# Patient Record
Sex: Male | Born: 1935 | Race: White | Hispanic: No | Smoking: Former smoker
Health system: Southern US, Community
[De-identification: ages and names within clinical notes are randomized; demographics above are authoritative.]

## PROBLEM LIST (undated history)

## (undated) DIAGNOSIS — I2510 Atherosclerotic heart disease of native coronary artery without angina pectoris: Secondary | ICD-10-CM

## (undated) DIAGNOSIS — I4891 Unspecified atrial fibrillation: Secondary | ICD-10-CM

## (undated) DIAGNOSIS — E785 Hyperlipidemia, unspecified: Secondary | ICD-10-CM

## (undated) DIAGNOSIS — I10 Essential (primary) hypertension: Secondary | ICD-10-CM

## (undated) DIAGNOSIS — M1990 Unspecified osteoarthritis, unspecified site: Secondary | ICD-10-CM

## (undated) HISTORY — PX: HERNIA REPAIR: SHX51

## (undated) HISTORY — DX: Unspecified osteoarthritis, unspecified site: M19.90

## (undated) HISTORY — DX: Hyperlipidemia, unspecified: E78.5

## (undated) HISTORY — DX: Atherosclerotic heart disease of native coronary artery without angina pectoris: I25.10

## (undated) HISTORY — PX: PROSTATECTOMY: SHX69

## (undated) HISTORY — DX: Unspecified atrial fibrillation: I48.91

## (undated) HISTORY — PX: OTHER SURGICAL HISTORY: SHX169

## (undated) HISTORY — DX: Essential (primary) hypertension: I10

---

## 1998-01-11 ENCOUNTER — Inpatient Hospital Stay (HOSPITAL_COMMUNITY): Admission: EM | Admit: 1998-01-11 | Discharge: 1998-01-17 | Payer: Self-pay | Admitting: Cardiology

## 1998-01-13 ENCOUNTER — Encounter: Payer: Self-pay | Admitting: Cardiology

## 1998-01-13 HISTORY — PX: CORONARY ARTERY BYPASS GRAFT: SHX141

## 1998-01-14 ENCOUNTER — Encounter: Payer: Self-pay | Admitting: Cardiology

## 1998-01-15 ENCOUNTER — Encounter: Payer: Self-pay | Admitting: Surgery

## 1998-12-26 ENCOUNTER — Encounter: Payer: Self-pay | Admitting: Urology

## 1998-12-28 ENCOUNTER — Ambulatory Visit (HOSPITAL_COMMUNITY): Admission: RE | Admit: 1998-12-28 | Discharge: 1998-12-28 | Payer: Self-pay | Admitting: Urology

## 2002-01-19 ENCOUNTER — Ambulatory Visit (HOSPITAL_COMMUNITY): Admission: RE | Admit: 2002-01-19 | Discharge: 2002-01-19 | Payer: Self-pay | Admitting: Cardiology

## 2002-01-19 ENCOUNTER — Encounter: Payer: Self-pay | Admitting: Cardiology

## 2002-01-27 ENCOUNTER — Encounter: Payer: Self-pay | Admitting: Cardiology

## 2002-01-27 ENCOUNTER — Ambulatory Visit (HOSPITAL_COMMUNITY): Admission: RE | Admit: 2002-01-27 | Discharge: 2002-01-27 | Payer: Self-pay | Admitting: Cardiology

## 2002-02-22 ENCOUNTER — Encounter: Payer: Self-pay | Admitting: Pediatrics

## 2002-02-22 ENCOUNTER — Inpatient Hospital Stay (HOSPITAL_COMMUNITY): Admission: RE | Admit: 2002-02-22 | Discharge: 2002-02-25 | Payer: Self-pay | Admitting: Orthopedic Surgery

## 2002-02-22 ENCOUNTER — Encounter: Payer: Self-pay | Admitting: Orthopedic Surgery

## 2002-02-25 ENCOUNTER — Ambulatory Visit (HOSPITAL_COMMUNITY)
Admission: RE | Admit: 2002-02-25 | Discharge: 2002-02-25 | Payer: Self-pay | Admitting: Physical Medicine & Rehabilitation

## 2002-02-25 ENCOUNTER — Inpatient Hospital Stay
Admission: RE | Admit: 2002-02-25 | Discharge: 2002-03-05 | Payer: Self-pay | Admitting: Physical Medicine & Rehabilitation

## 2002-03-01 ENCOUNTER — Encounter: Payer: Self-pay | Admitting: Physical Medicine & Rehabilitation

## 2002-03-14 ENCOUNTER — Observation Stay (HOSPITAL_COMMUNITY): Admission: RE | Admit: 2002-03-14 | Discharge: 2002-03-15 | Payer: Self-pay | Admitting: Urology

## 2002-03-22 ENCOUNTER — Inpatient Hospital Stay (HOSPITAL_COMMUNITY): Admission: EM | Admit: 2002-03-22 | Discharge: 2002-03-25 | Payer: Self-pay | Admitting: Urology

## 2002-03-22 ENCOUNTER — Emergency Department (HOSPITAL_COMMUNITY): Admission: EM | Admit: 2002-03-22 | Discharge: 2002-03-22 | Payer: Self-pay | Admitting: Emergency Medicine

## 2002-03-24 ENCOUNTER — Encounter: Payer: Self-pay | Admitting: Urology

## 2002-05-04 ENCOUNTER — Ambulatory Visit (HOSPITAL_COMMUNITY): Admission: RE | Admit: 2002-05-04 | Discharge: 2002-05-04 | Payer: Self-pay | Admitting: Internal Medicine

## 2002-05-04 ENCOUNTER — Encounter: Payer: Self-pay | Admitting: Cardiology

## 2002-08-01 ENCOUNTER — Encounter: Admission: RE | Admit: 2002-08-01 | Discharge: 2002-08-01 | Payer: Self-pay | Admitting: Orthopedic Surgery

## 2002-08-01 ENCOUNTER — Encounter: Payer: Self-pay | Admitting: Diagnostic Radiology

## 2002-08-01 ENCOUNTER — Encounter: Payer: Self-pay | Admitting: Orthopedic Surgery

## 2002-08-26 ENCOUNTER — Encounter: Payer: Self-pay | Admitting: Orthopedic Surgery

## 2002-08-26 ENCOUNTER — Encounter: Admission: RE | Admit: 2002-08-26 | Discharge: 2002-08-26 | Payer: Self-pay | Admitting: Orthopedic Surgery

## 2003-01-10 ENCOUNTER — Emergency Department (HOSPITAL_COMMUNITY): Admission: EM | Admit: 2003-01-10 | Discharge: 2003-01-11 | Payer: Self-pay | Admitting: Emergency Medicine

## 2003-02-16 ENCOUNTER — Encounter: Admission: RE | Admit: 2003-02-16 | Discharge: 2003-02-16 | Payer: Self-pay | Admitting: Orthopedic Surgery

## 2003-12-27 ENCOUNTER — Ambulatory Visit: Payer: Self-pay | Admitting: Gastroenterology

## 2004-04-17 ENCOUNTER — Ambulatory Visit: Payer: Self-pay | Admitting: Cardiology

## 2005-04-22 ENCOUNTER — Ambulatory Visit: Payer: Self-pay | Admitting: Cardiology

## 2006-07-06 ENCOUNTER — Encounter: Payer: Self-pay | Admitting: Cardiology

## 2006-07-06 ENCOUNTER — Ambulatory Visit: Payer: Self-pay

## 2006-07-06 ENCOUNTER — Ambulatory Visit: Payer: Self-pay | Admitting: Cardiology

## 2007-07-07 ENCOUNTER — Ambulatory Visit: Payer: Self-pay | Admitting: Cardiology

## 2007-12-15 ENCOUNTER — Ambulatory Visit: Payer: Self-pay | Admitting: Cardiology

## 2008-11-15 ENCOUNTER — Encounter: Payer: Self-pay | Admitting: Cardiology

## 2008-12-11 DIAGNOSIS — I10 Essential (primary) hypertension: Secondary | ICD-10-CM

## 2008-12-11 DIAGNOSIS — E785 Hyperlipidemia, unspecified: Secondary | ICD-10-CM | POA: Insufficient documentation

## 2008-12-11 DIAGNOSIS — I25810 Atherosclerosis of coronary artery bypass graft(s) without angina pectoris: Secondary | ICD-10-CM | POA: Insufficient documentation

## 2008-12-13 ENCOUNTER — Encounter: Payer: Self-pay | Admitting: Cardiology

## 2008-12-14 ENCOUNTER — Ambulatory Visit: Payer: Self-pay | Admitting: Cardiology

## 2009-10-23 ENCOUNTER — Encounter: Payer: Self-pay | Admitting: Cardiology

## 2009-12-17 ENCOUNTER — Encounter: Payer: Self-pay | Admitting: Cardiology

## 2009-12-17 ENCOUNTER — Ambulatory Visit: Payer: Self-pay | Admitting: Cardiology

## 2010-02-14 NOTE — Assessment & Plan Note (Signed)
Summary: f1y   Visit Type:  Follow-up- 1 year Primary Provider:  [place] [place]  CC:  No cardiac complaints- occasional edema..  History of Present Illness: The patient is 77 years old and return for management of CAD. He had bypass surgery in 2000. He had a catheterization done in 2007 which showed patent vein grafts after having an abnormal Myoview scan. He has been doing quite well has had no recent chest pain shortness of breath or palpitations. Despite his age he still works in the garden and mows his lawn.  He has a wife who is 38 years old and they've been married for 70 years.  His last echocardiogram was in June of 2008 at which time his ejection fraction was 55-60% and he had mild aortic stenosis.  Current Medications (verified): 1)  Metoprolol Succinate 25 Mg Xr24h-Tab (Metoprolol Succinate) .... Take One Tablet By Mouth Daily 2)  Cyanocobalamin 1000 Mcg/ml Soln (Cyanocobalamin) .Marland Kitchen.. 1 Tab Once Daily 3)  Multivitamins   Tabs (Multiple Vitamin) .Marland Kitchen.. 1 Tab By Mouth Once Daily Occasionally 4)  Simvastatin 80 Mg Tabs (Simvastatin) .... Take 1/2 Tab By Mouth Daily At Bedtime 5)  Potassium Chloride Crys Cr 20 Meq Cr-Tabs (Potassium Chloride Crys Cr) .... Take 1/2  Tablet By Mouth Daily 6)  Vitamin E 400 Unit  Caps (Vitamin E) .Marland Kitchen.. 1 Tab Once Daily 7)  Aspirin 81 Mg Tbec (Aspirin) .... Take One Tablet By Mouth Daily 8)  Hydromorphone Hcl 2 Mg Tabs (Hydromorphone Hcl) .... Take One Tab By Mouth Three Times A Day As Needed 9)  Tylenol 325 Mg Tabs (Acetaminophen) .... Take One Tab By Mouth Two To Three Times Daily As Needed 10)  Omeprazole 20 Mg Cpdr (Omeprazole) .... Take One To Two Tablets By Mouth Once Daily 11)  Garlic Oil 1000 Mg Caps (Garlic) .... Take One Capsule By Mouth Once Daily  Allergies (verified): No Known Drug Allergies  Past History:  Past Medical History: Reviewed history from 12/11/2008 and no changes required. 1. Coronary artery disease status post coronary bypass  graft surgery     in 2000 with patent grafts in 2004. 2. Good left ventricular function. 3. Hyperlipidemia. 4. Hypertension. 5. Degenerative joint disease.   Review of Systems       ROS is negative except as outlined in HPI.   Vital Signs:  Patient profile:   77 year old male Height:      70 inches Weight:      212 pounds BMI:     30.53 Pulse rate:   55 / minute BP sitting:   158 / 62  (left arm) Cuff size:   large  Vitals Entered By: Sherri Rad, RN, BSN (December 17, 2009 5:09 PM)  Physical Exam  Additional Exam:  Gen. Well-nourished, in no distress   Neck: No JVD, thyroid not enlarged, no carotid bruits Lungs: No tachypnea, clear without rales, rhonchi or wheezes Cardiovascular: Rhythm regular, PMI not displaced,  heart sounds  normal, 2/6 SEM, trace bilateral  peripheral edema, pulses normal in all 4 extremities. Abdomen: BS normal, abdomen soft and non-tender without masses or organomegaly, no hepatosplenomegaly. MS: No deformities, no cyanosis or clubbing   Neuro:  No focal sns   Skin:  no lesions    Impression & Recommendations:  Problem # 1:  CAD, AUTOLOGOUS BYPASS GRAFT (ICD-414.02) S/P CABG with patent grafts 2007.  No CP .  Stable. His updated medication list for this problem includes:    Metoprolol Succinate 25  Mg Xr24h-tab (Metoprolol succinate) .Marland Kitchen... Take one tablet by mouth daily    Aspirin 81 Mg Tbec (Aspirin) .Marland Kitchen... Take one tablet by mouth daily  Orders: EKG w/ Interpretation (93000)  Problem # 2:  HYPERTENSION, BENIGN (ICD-401.1) BP have been low on other readings.  Stable. His updated medication list for this problem includes:    Metoprolol Succinate 25 Mg Xr24h-tab (Metoprolol succinate) .Marland Kitchen... Take one tablet by mouth daily    Aspirin 81 Mg Tbec (Aspirin) .Marland Kitchen... Take one tablet by mouth daily    Furosemide 20 Mg Tabs (Furosemide) .Marland Kitchen... Take one tablet by mouth daily as needed  Problem # 3:  HYPERLIPIDEMIA-MIXED (ICD-272.4) Managed with  Simvistatin.  Good readings by PCP. His updated medication list for this problem includes:    Simvastatin 80 Mg Tabs (Simvastatin) .Marland Kitchen... Take 1/2 tab by mouth daily at bedtime  Patient Instructions: 1)  Take Lasix (Furosemide) 20mg  one tab by mouth once daily as needed. 2)  Your physician wants you to follow-up in:  1 year with Dr. Clifton James. You will receive a reminder letter in the mail two months in advance. If you don't receive a letter, please call our office to schedule the follow-up appointment. Prescriptions: FUROSEMIDE 20 MG TABS (FUROSEMIDE) Take one tablet by mouth daily as needed  #30 x 4   Entered by:   Sherri Rad, RN, BSN   Authorized by:   Lenoria Farrier, MD, [facility]   Signed by:   Sherri Rad, RN, BSN on 12/17/2009   Method used:   Electronically to        [facility]* (retail)       [address]       [place], [place]  [address]       Ph: [phone]       Fax: [phone]   RxID:   [id number]

## 2010-05-28 NOTE — Assessment & Plan Note (Signed)
[facility]                            CARDIOLOGY OFFICE NOTE   NAME:George [name], George [name]                         MRN:          [id number]  DATE:07/06/2006                            DOB:          1916/04/14    PRIMARY CARE PHYSICIAN:  Dr. Nadine Counts, [place].   CLINICAL HISTORY:  George [name] is 77 years old and returns for followup  management of his coronary heart disease. He had bypass surgery in 2000  and had an abnormal Cardiolite in 2004 prior to knee surgery and  underwent catheterization and was found to have a patent graft at that  time. He says he has been doing well from the standpoint of his heart,  has had no recent chest pain, shortness of breath or palpitations.   PAST MEDICAL HISTORY:  Significant for degenerative joint disease and he  is status post right total knee replacement. He has hyperlipidemia.   CURRENT MEDICATIONS:  Simvastatin, Prilosec, aspirin, diclofenac,  metoprolol and vitamins.   SOCIAL HISTORY:  His wife who lives with him is 90 years hold and she  had a hip fracture 3 years ago and her health is not as good as his.   PHYSICAL EXAMINATION:  VITAL SIGNS:  Her blood pressure is 144/66 and  her pulse 66 and regular.  NECK:  There was no vein distention. Carotid pulses were full without  bruits.  CHEST:  Clear.  HEART:  Cardiac rhythm was regular. There was a grade 2/6 high pitch  murmur at the left sternal edge which could be heard halfway out to the  apex. I could hear no diastolic murmur.  ABDOMEN:  Soft with normal bowel sounds. There was no  hepatosplenomegaly.  EXTREMITIES:  Peripheral pulses were full and there was no peripheral  edema.   Electrocardiogram showed first degree AV block and sinus bradycardia and  was otherwise normal.   IMPRESSION:  1. Coronary artery disease status post coronary artery bypass graft      February 2000 with patent grafts 2004.  2. Good left ventricular function.  3. Systolic  murmur, questionable mitral in origin.  4. Hyperlipidemia.  5. Degenerative joint disease.   RECOMMENDATIONS:  I think George [name] is doing well. I will plan to get  an echocardiogram to evaluate his murmur. I am __________  certain that  this is aortic murmur or mitral murmur.  Will plan to see him back in 1 year. I also gave him a prescription for  nitroglycerin. George [name] had recently done laboratory studies on him.     George Elvera Lennox Juanda Chance, MD, [facility]  Electronically Signed    BRB/MedQ  DD: 07/06/2006  DT: 07/07/2006  Job #: [id number]

## 2010-05-28 NOTE — Assessment & Plan Note (Signed)
[facility]                            CARDIOLOGY OFFICE NOTE   NAME:[name], [name]                         MRN:          [id number]  DATE:12/15/2007                            DOB:          1916-03-25    PRIMARY CARE PHYSICIAN:  Dr. Nadine Counts.   CLINICAL HISTORY:  George [name] is 77 years old and returns for followup  management of his coronary heart disease.  He had bypass surgery in  2000, which was performed emergently and has done well since that time.  He had a cath in 2004 after an abnormal Cardiolite, which showed patent  grafts.  He has done well recently with no recent chest pain, shortness  of breath, or palpitations.   His past medical history is significant for right total knee  replacement.  He has hyperlipidemia and hypertension.   His current medications include simvastatin 80 mg one-half tablet at  bedtime, potassium 20 mEq one-half tablet daily, Prilosec, aspirin 81 mg  daily, metoprolol 50 mg daily, and vitamins.   SOCIAL HISTORY:  He lives with his wife who is 33 years old.  She is  currently in rehab following an injury.  He does not smoke.   On physical examination, blood pressure is 130/65 and pulse 56 and  regular.  There was no venous tension.  The carotid pulses were full  without bruits.  Chest was clear.  Cardiac rhythm was regular.  He had  no murmurs or gallops.  Abdomen was soft without organomegaly.  Peripheral pulses were full and there is no peripheral edema.   Electrocardiogram showed increased voltage and sinus bradycardia.   IMPRESSION:  1. Coronary artery disease status post coronary bypass graft surgery      in 2000 with patent grafts in 2004.  2. Good left ventricular function.  3. Hyperlipidemia.  4. Hypertension.  5. Degenerative joint disease.   RECOMMENDATIONS:  I think George [name] is doing quite well.  Dr. Harrison Mons  has been following his cholesterol panel.  His other risk factors seem  to be  under good control.  I will plan to see him back in followup in a  year.     Bruce Elvera Lennox Juanda Chance, MD, [facility]  Electronically Signed   BRB/MedQ  DD: 12/15/2007  DT: 12/16/2007  Job #: [id number]

## 2010-05-28 NOTE — Assessment & Plan Note (Signed)
[facility]                            CARDIOLOGY OFFICE NOTE   NAME:[name], [name]                         MRN:          [id number]  DATE:07/07/2007                            DOB:          10-28-1916    PRIMARY CARDIOLOGIST:  George Beals. Juanda Chance, MD, [facility]   PRIMARY CARE George [name]:  George [name] in [place].   PATIENT PROFILE:  An 77 year old Caucasian male with a prior history of  CAD who presents for yearly followup.   PROBLEMS:  1. CAD.      a.     Status post CABG in February 2000 with patent grafts on       catheterization in 2004.  2. History of normal LV function.  3. Mild aortic stenosis by echo in 2008.  4. Hyperlipidemia.  5. Degenerative joint disease.   HISTORY OF PRESENT ILLNESS:  An 77 year old Caucasian male with the  above problem list.  He was last seen about a year ago.  Over the past  year, he has done well and has remained active working in his half-acre  garden, mowing his acre yard, and also taking care of his wife who has a  respiratory illness.  He is active around the home, prepares all the  meals, and helps to bathe and clothe his wife.  Despite all this  activity, he has not had any symptoms or limitations and is doing  remarkably well.  He offers no complaints today.   ALLERGIES:  CODEINE.   HOME MEDICATIONS:  1. Simvastatin 40 mg nightly.  2. Potassium 10 mEq daily.  3. Omeprazole 20 mg b.i.d.  4. Aspirin 81 mg daily.  5. Metoprolol 25 mg b.i.d.  6. Vitamin B12 1000 mg daily.  7. B complex daily.  8. Nitroglycerin p.r.n.   PHYSICAL EXAMINATION:  Blood pressure 128/65, heart rate 50,  respirations 16.  He is afebrile.  His weigh is 210 pounds.  Pleasant  white male in no acute distress, awake, alert and oriented x3.  HEENT:  Normal.  NEURO:  Grossly intact, nonfocal.  SKIN:  Warm and dry without lesions or masses.  NECK:  No bruits or JVD.  LUNGS:  Respirations are unlabored, clear to auscultation.  CARDIAC:  Regular S1 and S2, no S3 or S4, with a 2/6 systolic ejection  murmur at the right upper sternal border.  ABDOMEN:  Round, soft, nontender, nondistended.  Bowel sounds present.  EXTREMITIES:  Lower extremities warm, dry, pink.  No clubbing, cyanosis,  or edema.  Dorsalis pedis and posterior tibial pulses 2+ and equal  bilaterally.   ACCESSORY CLINICAL FINDINGS:  None.   ASSESSMENT/PLAN:  1. Coronary artery disease.  The patient is doing well and remains on      aspirin, statin, and beta-blocker therapy.  2. Hyperlipidemia.  The patient remains on statin therapy.  He had an      LDL in April that was 90.  Total cholesterol 146.  His LFTs were      normal at that time.  3. Mild aortic stenosis.  The patient is asymptomatic.  He had an      echocardiogram a year ago.   DISPOSITION:  Follow up with George [name] in 6 months or sooner if  necessary.      George [name], ANP  Electronically Signed      George Beals. Juanda Chance, MD, [facility]  Electronically Signed   CB/MedQ  DD: 07/07/2007  DT: 07/08/2007  Job #: [id number]

## 2010-05-31 NOTE — Op Note (Signed)
NAME:  George [name], George [name]                       ACCOUNT NO.:  [id number]   MEDICAL RECORD NO.:  [id number]                   PATIENT TYPE:  INP   LOCATION:  5005                                 FACILITY:  [facility]   PHYSICIAN:  Burnard Bunting, M.D.                 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   PROCEDURE:  Right total knee arthroplasty.   SURGEON:  Burnard Bunting, M.D.   ANESTHESIA:  General endotracheal.   ESTIMATED BLOOD LOSS:  100 mL.  Drains; Hemovac x1.   TOURNIQUET TIME:  2 hours at 300 mmHg.   COMPONENTS:  Cemented 11 femur, 9 tibia with 10 mm insert, and cemented 9  resurfaced patella.   DESCRIPTION OF PROCEDURE:  The patient was brought to the operating room  where general endotracheal anesthesia was induced.  Preoperative IV  antibiotics were administered.  The right leg was prepped with Duraprep  solution and draped in a sterile manner. The patient had post traumatic  valgus deformity at the junction of the proximal third and middle third of  the tibial.  The leg was elevated and exsanguinated with the Esmarch wrap  and the tourniquet was inflated. Anterior incision was utilized. The skin  and subcutaneous tissue were sharply divided.  A median parapatellar  arthrotomy was utilized.  0 Vicryl suture was used to mark the anatomic  location of the division of the distal quadriceps tendon for later anatomic  reapproximation. The lateral patellofemoral ligament was released. Fat pad  was excised.  No stripping of the medial collateral ligament was performed.  There was careful stripping of the lateral aspect was performed.  Osteophytes were removed.  The patient had significant lateral compartment  wear. The knee was flexed.  A starting point was made slightly medial to the  standard location in order to allow for increased varus in the distal  femoral cut.  Chondral cartilage was removed from the articular surface of  the medial femoral condyle to allow for less than 5 degrees valgus cut on  the distal femur. The distal femoral cut was made.  The femur was sized to a  size 11.  Anterior, posterior, and chamfer cuts were then performed.  At  this time, attention was directed toward the tibia.  According to the  preoperative planning and preoperative templating, a medial wedge type cut  was performed. This would align the mechanical axis more squarely under the  hip.  The external alignment guide was utilized and a currently medial sided  cut was made to allow the leg to swing into varus.  Following the posterior  retractor and collateral ligament retractors were  placed prior to making  that cut.  Following that cut, the lateral ligaments were partially released  off of the femur.  This included the popliteus and lateral collateral  ligament.  Iliotibial band was also partially released off of Gerdy's  tubercle.  Good balance, flexion, and extension was achieved.  Box cut was  then made and the trial components were placed.  The patella tracked nicely  and the knee had slight increased laxity to valgus stress, but overall was  well balanced. The patella was then cut and resurfaced in a free-hand manner  and sized to a size 9 patella.  At this time, the trial components were  removed.  The knee was thoroughly irrigated with pulsatile lavage.  With  trial components in place, the leg had a neutral alignment.  This was  checked with intraoperative x-ray.  At this time, the tourniquet was  released.  Bleeding points were controlled using electrocautery.  The  components were then cemented into position including the femur, tibia, and  patella.  The 10 spacer was then placed. The knee had a  good range of  motion.  A lateral release was not required because the patella tracked very  nicely. The patient had good range of motion.  The tibia which was cut in 0  degrees slope was fitted with the Hyperflex insert.  At this time the  Hemovac drain was placed and the MCL imbrication was performed using #1  suture.  This gave good balance to the collateral ligaments in extension and  flexion.  The arthrotomy was then closed using #1 and 0 Vicryl suture.  Skin  was closed using interrupted inverted 2-0 Vicryl suture and skin staples.  The patient was then placed into a knee immobilizer. He tolerated the  procedure well without immediate complications.  He was transferred to the  recovery room in stable condition.                                               Burnard Bunting, M.D.    GSD/MEDQ  D:  02/25/2002  T:  02/25/2002  Job:  [id number]
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
## 2010-05-31 NOTE — Op Note (Signed)
   NAME:  George [name], George [name]                       ACCOUNT NO.:  [id number]   MEDICAL RECORD NO.:  [id number]                   PATIENT TYPE:  AMB   LOCATION:  DAY                                  FACILITY:  [facility]   PHYSICIAN:  Sigmund I. Patsi Sears, M.D.         DATE OF BIRTH:  06-18-1916   DATE OF PROCEDURE:  03/14/2002  DATE OF DISCHARGE:                                 OPERATIVE REPORT   PREOPERATIVE DIAGNOSES:  1. Urethral stricture.  2. Bladder neck contracture.   POSTOPERATIVE DIAGNOSES:  1. Urethral stricture.  2. Bladder neck contracture.   PROCEDURES:  1. Cystourethroscopy.  2. Transurethral incision of bladder neck contracture.  3. Urethral dilation.  4. Urolume stent placement at bladder neck.   SURGEON:  Sigmund I. Patsi Sears, M.D.   ANESTHESIA:  General endotracheal.   PREPARATION:  After appropriate preanesthesia, the patient was brought to  the operating room and placed on the operating table in the dorsal supine  position.  The patient failed spinal anesthetic placement and general LMA  placement, and then had general endotracheal anesthesia.  The patient  stabilized and then underwent cystourethroscopy, which showed that the  patient had a penile prosthesis in place, a bladder neck contracture.  This  was incised and dilated to a size 28 [place] and then measured.  The patient  then had a 2.5 cm Urolume urethral stent placed, which was visually noted to  be just inside the bladder neck, approximately 0.5-1 cm, to a point just  proximal to the sphincter.  The sphincter was noted to visually close.  There was some bleeding noted from the incision of the bladder neck  contracture and the urethral stricture, and these areas were cauterized as  best as possible.  The bladder was drained of fluid with a 14 French  catheter.  The patient tolerated the procedure well.  He is going to be  admitted for 23-hour observation.           Sigmund I. Patsi Sears, M.D.    SIT/MEDQ  D:  03/14/2002  T:  03/14/2002  Job:  [id number]

## 2010-05-31 NOTE — Discharge Summary (Signed)
NAME:  George [name], George [name]                       ACCOUNT NO.:  [id number]   MEDICAL RECORD NO.:  [id number]                   PATIENT TYPE:  INP   LOCATION:                                       FACILITY:  [facility]   PHYSICIAN:  Ellwood Dense, M.D.                DATE OF BIRTH:  [birth year]   DATE OF ADMISSION:  02/25/2002  DATE OF DISCHARGE:  03/05/2002                                 DISCHARGE SUMMARY   DISCHARGE DIAGNOSES:  1. Right total knee arthroplasty on February 22, 2002.  2. Anemia.  3. Coumadin for deep venous thrombosis prophylaxis.  4. Benign prostatic hypertrophy.  5. Coronary artery disease by coronary artery bypass grafting.   HISTORY OF PRESENT ILLNESS:  This 77 year old male was admitted on February 22, 2002, with end-stage degenerative joint disease of the right knee and no  relief with conservative care.  He underwent a right total knee arthroplasty  on February 22, 2002, per Burnard Bunting, M.D.  Placed on Coumadin for deep  venous thrombosis prophylaxis and weightbearing as tolerated.  Postoperative  distal and proximal upper extremity weakness.  Neurology consult with  Deanna Artis. Sharene Skeans, M.D.  Cervical CT showed a bulging, questionable  herniated disk without critical spinal stenosis and no obvious lesion  identified.  He continued to improve with strength returning fully to 5/5.  No further work-up was requested.  He did have hospital course anemia after  surgery and received transfusion.  He was found to have an NCL laxity and a  brace was applied per the orthopedic service x 6 weeks.  Urology received  follow-up for difficulty placing a Foley catheter tube and a history of  penial prosthesis for which he received follow-up with Sigmund I.  Patsi Sears, M.D., for and tolerated the procedure well.  He was moderate  assist for bed mobility and supervision for his ambulation.  He was admitted  for comprehensive rehabilitation program.   PAST MEDICAL HISTORY:   See discharge diagnoses.   ALLERGIES:  None.   SOCIAL HISTORY:  Denies alcohol or tobacco.   MEDICATIONS PRIOR TO ADMISSION:  1. Multivitamins.  2. Vitamin E.  3. Iron.  4. Lopressor.  5. Zocor.  6. Potassium supplement.  7. Aspirin.   SOCIAL HISTORY:  Lives with wife in [place], [place].  Independent  with a cane prior to admission.  One-level home with one step to entry.  Wife with bilateral total hip replacement with limited assistance.  Local  son works day shift.   HOSPITAL COURSE:  The patient did well while on rehabilitation services with  therapies initiated on a daily basis. The following issues were followed  during the patient's rehabilitation course.  Pertaining to the patient's  right total knee replacement, surgical site healing nicely.  Staples  remained intact.  He did have some obvious swelling to the site.  Follow-up  x-rays showed no loosening  of components, but there was a large joint  effusion which Burnard Bunting, M.D., was made aware of.  Will continue to  follow.  No obvious hematoma identified.  He would follow up with Dr. August Saucer  on March 11, 2002.  His active range of motion was still at 80 degrees.  He continued on Coumadin for deep venous thrombosis prophylaxis.  This would  be followed up per protocol by [facility].  Blood pressures remained  controlled with Lopressor.  There were no orthostatic changes.  For  postoperative anemia, he remained on iron supplement with latest hemoglobin  9.1.  He had no chest pain or shortness of breath.  Noted history of benign  prostatic hypertrophy with TURP in the past, followed by Sigmund I.  Patsi Sears, M.D.  He did have some urgency of bladder, but this had improved  during his rehabilitation stay.  Order was given for bladder scans to check  for any urinary retention.  He would follow up with Dr. Patsi Sears.   The latest labs showed an INR of 2.8, hemoglobin 9.1, hematocrit 26.8,  sodium 131,  potassium 4.1, BUN 27, and creatinine 1.8.   DISCHARGE MEDICATIONS:  1. Coumadin with latest dose of 6 mg.  He would complete protocol by March 23, 2002.  2. Multivitamins daily.  3. Trinsicon twice daily.  4. Lopressor 25 mg every 12 hours.  5. Zocor 40 mg daily.  6. Potassium chloride 20 mEq daily.  7. Tylox as needed for pain.   ACTIVITY:  Weightbearing as tolerated with knee brace when out of bed.   DIET:  Regular.   WOUND CARE:  Cleanse incision daily with warm soap and water.  Call G. Dorene Grebe, M.D., if any increased redness, drainage, or fever.   FOLLOW-UP:  Home health physical and occupational therapy.  Home health  nurse to be arranged for [facility] for completion of Coumadin  protocol.  Follow up with Nadine Counts, for medical management.     Mariam Dollar, P.A.                     Ellwood Dense, M.D.    DA/MEDQ  D:  03/04/2002  T:  03/04/2002  Job:  [id number]   cc:   G. Dorene Grebe, M.D.  [address]  [place]  [place] [address]  Fax: [phone]  [address]  [place] [address]  Fax: [phone]   Sigmund I. Patsi Sears, M.D.  [address]  [place]  [place] [address]  Fax: [phone]

## 2010-05-31 NOTE — H&P (Signed)
NAME:  George [name], George [name]                       ACCOUNT NO.:  [id number]   MEDICAL RECORD NO.:  [id number]                   PATIENT TYPE:  INP   LOCATION:  0371                                 FACILITY:  [facility]   PHYSICIAN:  Sigmund I. Patsi Sears, M.D.         DATE OF BIRTH:  February 13, 1916   DATE OF ADMISSION:  03/22/2002  DATE OF DISCHARGE:                                HISTORY & PHYSICAL   HISTORY OF PRESENT ILLNESS:  This is an 77 year old male who is status post  radical prostatectomy from an adenocarcinoma of the prostate in 1989.  He is  also status post implantation of Mentor penile prosthesis (semi-rigid) in  1991.  The patient had orthopedic surgery on his knee approximately one  month ago.  During the hospital, he had problems with urinary retention and  had to have a cystoscope assisted placement of a urinary catheter.  The  patient has returned to Dr. Eston Esters office on two different occasions to  have a voiding trial, which he has been able to successfully void after  removal of the catheter.  Approximately last evening the patient states that  he took a triple dose of Coumadin per the [facility] orders which  equalled to about 7 to 7.5 mg of Coumadin.  As the night progressed, the  patient became unable to void and by this morning had problems with urinary  retention.  He went to the emergency room at [facility], where the  emergency room physicians placed the catheter after several tries.  He  presented to our office today with a catheter in place and voiding some  blood.  In the office today we took out the present catheter and tried to  examine the patient's bladder with the cystoscope, and we were unable to  pass the cystoscope into the bladder.  The patient is also two weeks status  post placement of a urethral stent.  Since we were unable to pass the  catheter into the bladder or visualize the bladder.  We desired to admit the  patient to the  hospital today for possible placement of suprapubic tube  tomorrow under general anesthesia if he does not  void this p.m.   PAST MEDICAL HISTORY:  The patient has a history of coronary artery disease  with a coronary artery bypass graft in the past.   ALLERGIES:  CODEINE and MORPHINE.   MEDICATIONS:  1. Coumadin.  2. Potassium supplement.  3. Toprol 25 mg every day.  4. Bextra 10 mg every day.  5. Tylenol p.r.n.   PHYSICAL EXAMINATION:  GENERAL:  Well-developed, well-nourished, well-  appearing white male in no acute distress at this time, but he does not feel  he has to void at this time either.  VITAL SIGNS:  Stable.  He is afebrile.  HEART:  Regular rate and rhythm without murmurs, rubs or gallops.  LUNGS:  Clear to auscultation bilaterally.  ABDOMEN:  Soft and nontender.  No palpable bladder at this time.  GENITALIA:  The patient has a semi-rigid penile prosthesis, and his urethra  is currently dribbling some blood at this point.  EXTREMITIES:  No significant edema noted.  Some edema noted around the  surgical site of his knee replacement.   ASSESSMENT:  Urinary retention.   PLAN:  We will admit this patient directly to [facility] at [facility] and will  plan to operate tomorrow around noon to put a suprapubic tube in place if  the patient is still in retention in the morning time.  If he is able to  void over the night, then we will re-evaluate our treatment plan in the  morning based on what happens with him overnight.     Terri Piedra, N.P.                         Sigmund I. Patsi Sears, M.D.    HB/MEDQ  D:  03/22/2002  T:  03/22/2002  Job:  [id number]   cc:   Rosanne Sack, M.D.  [address]  [place], [place] [address]  Fax: [phone]

## 2010-05-31 NOTE — Cardiovascular Report (Signed)
NAME:  George [name], George [name]                       ACCOUNT NO.:  [id number]   MEDICAL RECORD NO.:  [id number]                   PATIENT TYPE:  OIB   LOCATION:  2895                                 FACILITY:  [facility]   PHYSICIAN:  Charlies Constable, M.D. [facility]              DATE OF BIRTH:  Apr 22, 1916   DATE OF PROCEDURE:  01/19/2002  DATE OF DISCHARGE:                              CARDIAC CATHETERIZATION   CLINICAL HISTORY:  The patient is 77 years old and had bypass surgery in  2000.  He has done well since that time and was scheduled for knee surgery.  We saw him for a preoperative evaluation and arranged from him to have a  Cardiolite scan.  This suggested inferobasilar scar with superimposed  ischemia.  For this reason, he was scheduled for evaluation with  angiography.   PROCEDURE:  The procedure was performed via the right femoral artery with  arterial sheath and 6 French pre-formed coronary catheters.  The __________  were performed and Omnipaque contrast was used.  A JL4 catheter was used for  injection in both vein grafts.  A LIMA graft was used for injection in the  LIMA graft.  A distal aortogram was performed to rule out abdominal aortic  aneurysm.  The patient tolerated the procedure well and left the laboratory  in satisfactory condition.   RESULTS:  1. Left main coronary artery:  The left main coronary artery was free of     significant disease.  2. Left anterior descending coronary artery:  The left anterior descending     coronary artery gave rise to two diagonal branches and two septal     perforators and then there was competing flow distally.  There was 77%     narrowing in the proximal LAD just before a stent, which did not appear     to have any significant stenosis.  3. Circumflex artery:  The circumflex artery gave rise to a large marginal     branch which had competing flow distally.  There was 80% stenosis in the     proximal portion of the marginal branch.  The AV  circumflex was     completely occluded after the marginal branch.  4. Right coronary artery:  The right coronary artery was completely occluded     proximally.  5. The saphenous vein graft to the posterior descending and posterolateral     branch of the right coronary artery was patent and functioned normally.  6. The saphenous vein graft to the marginal branch of the circumflex artery     was patent and functioned normally.  7. The left internal mammary artery graft to the LAD was patent and     functioned normally.  There was some irregularity in the distal LAD.  8. The left ventriculogram performed in the RAO projection showed good wall     motion and no hypokinesis.  The estimated ejection  fraction was 55%.  9. A distal aortogram was performed and showed patent renal arteries and no     significant aortoiliac obstruction.   The aortic pressure was 165/74 with a mean of 109 and the left ventricular  pressure was 165/19.   CONCLUSION:  1. Coronary artery disease, status post coronary artery bypass graft surgery     in 2000.  2. Severe native vessel disease with 70%  narrowing in the proximal left     anterior descending, 80% narrowing in the marginal branch of the     circumflex artery with total occlusion of the distal circumflex artery,     and total occlusion of the right coronary artery.  3. Patent sequential saphenous vein graft to the posterior descending and     posterolateral branch of the right coronary artery, patent vein graft to     the marginal branch of the circumflex artery, and patent left internal     mammary artery to the left anterior descending with normal left     ventricular function.   RECOMMENDATIONS:  There is no source of ischemia.  In view of these  findings, I suspect the scan was probably a false positive.  The distal  circumflex is occluded, but I suspect it is a very small vessel.  I do not  see any wall motion abnormality on the ventriculogram.   Will plan  reassurance and the patient will be cleared for total knee replacement.                                               Charlies Constable, M.D. [facility]    BB/MEDQ  D:  01/19/2002  T:  01/19/2002  Job:  [id number]   cc:   Nadine Counts  [address]  [place]  [place] [address]  Fax: [phone]   G. Dorene Grebe, M.D.  [address]  [place]  [place] [address]  Fax: [phone]

## 2010-05-31 NOTE — Discharge Summary (Signed)
   NAME:  George [name], George [name]                       ACCOUNT NO.:  [id number]   MEDICAL RECORD NO.:  [id number]                   PATIENT TYPE:  INP   LOCATION:  5005                                 FACILITY:  [facility]   PHYSICIAN:  Burnard Bunting, M.D.                 DATE OF BIRTH:  1916-01-21   DATE OF ADMISSION:  02/22/2002  DATE OF DISCHARGE:  02/25/2002                                 DISCHARGE SUMMARY   DISCHARGE DIAGNOSIS:  Right knee arthritis.   SECONDARY DIAGNOSES:  1. Prostate surgery.  2. Hypertension.  3. Coronary artery disease, status post coronary artery bypass in year 2000.   PROCEDURE:  Right total knee replacement.   CONSULTATIONS:  Neurology for postoperative arm weakness.   HOSPITAL COURSE:  The patient is an 77 year old with right knee arthritis.  He underwent total knee arthroplasty 02/22/02.  This was complicated by  posttraumatic deformity in the kidney.  The patient tolerated the procedure  well without immediate complication.  However, in the recovery room he was  noted to have bilateral arm weakness.  Neurology consultation was obtained.  CT of the C spine demonstrated no spinal stenosis or syrinx or mass lesion.  The patient had gradual recovery of motor and sensory function in bilateral  upper extremities.  The patient was started on Coumadin for DVT prophylaxis.  The cervical CT did show spondylosis with bulging herniated disk, but  without critical spinal stenosis.  No mass lesions were noted.  No  subluxation of fracture was noted.  The patient did improve and  regain his  preoperative upper extremity status.  His lower extremities did well.  He  was transfused for a hemoglobin of 8.1 on 02/23/02.  He was started on CPN  for range of motion.  He was also fitted for a brace for MCL laxity.  He did  require urology consultation intraop for placement of Foley catheter.  Urology consultants managed the Foley postop.  The Foley was removed within  48 hours of  its placement.  The patient had an otherwise unremarkable  recovery.  He was transferred to rehab on 02/25/02 in good condition.   DISCHARGE MEDICATIONS:  1. Preadmission medications.  2. Percocet one or two p.o. q.3-4h p.r.n. pain.                                               Burnard Bunting, M.D.    GSD/MEDQ  D:  04/04/2002  T:  04/04/2002  Job:  [id number]

## 2010-05-31 NOTE — Discharge Summary (Signed)
NAME:  George [name], George [name]                       ACCOUNT NO.:  [id number]   MEDICAL RECORD NO.:  [id number]                   PATIENT TYPE:  OUT   LOCATION:  DFTL                                 FACILITY:  [facility]   PHYSICIAN:  Sigmund I. Patsi Sears, M.D.         DATE OF BIRTH:  [birth year]   DATE OF ADMISSION:  02/25/2002  DATE OF DISCHARGE:  02/25/2002                                 DISCHARGE SUMMARY   FINAL DIAGNOSES:  1. Acute urinary retention.  2. Posterior urethral trauma.  3. Prostate cancer.   OPERATIONS:  1. On 03/23/02, cystoscopy, suprapubic tube insertion.  2. On 03/24/02, cystogram and reinsertion of SP tube (Dr. Santa Lighter).   HISTORY OF PRESENT ILLNESS:  The patient is an 77 year old male, status post  radical prostatectomy in 1989 secondary to carcinoma of the prostate.  The  patient is also status post implantation of Mentor semi-rigid penile  prosthesis in 1991.  He did quite well until 2004, when he underwent  orthopaedic knee surgery, and developed urinary retention.  The patient  underwent a cystoscopy, assisted placement of Foley catheter, and has failed  two voiding trials since that time.  The patient eventually underwent  placement of a posterior urethral stent, and voiding well until replaced on  Coumadin, when he developed gross hematuria, clot retention.  He then went  to the emergency room and had a Foley catheter placed through the stent,  with some urethral trauma.  Cystoscopy again showed trauma to the posterior  urethra and the urethra could not be traversed.  He was therefore emergently  admitted to the hospital for cystoscopy under anesthesia and suprapubic tube  insertion.   PAST MEDICAL HISTORY:  Coronary artery disease, coronary artery bypass  grafting.   ALLERGIES:  1. CODEINE.  2. MORPHINE.   MEDICATIONS:  1. Coumadin (currently off).  2. Potassium.  3. Toprol 25 mg.  4. Bextra 10 mg.  5. Tylenol p.r.n.   PHYSICAL EXAMINATION:   A well-developed, well-nourished elderly white male  in no acute distress.  The remaining physical examination is as noted in the  H&P of 03/22/02.   LABORATORY DATA:  The patient had a sodium of 137, potassium 4.1, chloride  103, CO2 25, BUN 23, creatinine 1.1.  Liver function tests were normal.  Admission prothrombin time was 17.9, INR 1.6, PTT 33 (normal).  White blood  cell count 9600, hemoglobin 10.9, hematocrit 32.1.   HOSPITAL COURSE:  The patient was admitted to the urology service where he  was followed during the night, he was able to dribble some urine.  He  underwent cystoscopy under anesthesia with the finding of some posterior  displacement of the stent, but it was basically felt to be in reasonable  position in the posterior region.  Therefore, a suprapubic tube was placed.  However, on the next day, prior to the patient's discharge, he was unable to  void, suprapubic tube did not work  and would not irrigate.  Therefore, he  was taken to the radiology suite where he underwent cystogram which showed  that the suprapubic tube was not in good position in the bladder.  It was  therefore removed and a separate suprapubic tube was placed under ultrasound  control by Dr. Elly Modena.  This was sutured in place.  The patient has  learned how to use the SP tube, and will try to void on his own, and will  use the SP tube as a safety device.  The patient was seen in consultation by  Dr. Jamie Brookes for a combination of anemia, heart disease, and bleeding  reversal.  The patient will be allowed to be discharged on aspirin only.  He  should not take Coumadin.   CONDITION ON DISCHARGE:  Stable.                                                  Sigmund I. Patsi Sears, M.D.    SIT/MEDQ  D:  03/25/2002  T:  03/25/2002  Job:  [id number]   cc:   Rosanne Sack, M.D.  [address]  [place], [place] [address]  Fax: [phone]

## 2010-05-31 NOTE — Consult Note (Signed)
   NAME:  George [name], George [name]                       ACCOUNT NO.:  [id number]   MEDICAL RECORD NO.:  [id number]                   PATIENT TYPE:  INP   LOCATION:                                       FACILITY:  [facility]   PHYSICIAN:  Lindaann Slough, M.D.               DATE OF BIRTH:  1916/09/12   DATE OF CONSULTATION:  03/04/2002  DATE OF DISCHARGE:  03/05/2002                                   CONSULTATION   REFERRING PHYSICIAN:  Dr. Ellwood Dense, M.D.   REASON FOR CONSULTATION:  Inability to urinate and inability to insert a  Foley catheter.   HISTORY OF PRESENT ILLNESS:  The patient is an 77 year old male, status post  right total knee arthroplasty on February 22, 2002.  He has been having  difficulty voiding and the nurses could not insert a Foley catheter.  The  patient is status post radical prostatectomy in 1989 by Dr. Lynelle Smoke I.  Tannenbaum, and he has a history of stricture.  He is also status post  insertion of penile prosthesis.   DESCRIPTION OF PROCEDURE:  A flexible cystoscope was inserted in the bladder  and the patient has a stricture in the posterior urethra.  A guidewire was  passed through the cystoscope and into the bladder and the cystoscope was  advanced over the guidewire into the bladder.  The cystoscope was then  removed.  A #16 Councill catheter was passed over the guidewire into the  bladder.  About 1000 mL of urine were drained out of the bladder.  The  catheter was left to straight drainage.   RECOMMENDATION:  The patient will be followed by Dr. Patsi Sears next week  for removal of the Foley catheter.  If the patient is ready for discharge,  he can go home with the Foley.                                               Lindaann Slough, M.D.    MN/MEDQ  D:  03/04/2002  T:  03/05/2002  Job:  [id number]   cc:   Ellwood Dense, M.D.  [address]  [place]  [place] [address]  Fax: [phone]   Sigmund I. Patsi Sears, M.D.  [address]  [place]  [place] [address]  Fax: [phone]

## 2010-05-31 NOTE — Op Note (Signed)
NAME:  George [name], George [name]                       ACCOUNT NO.:  [id number]   MEDICAL RECORD NO.:  [id number]                   PATIENT TYPE:  INP   LOCATION:  0371                                 FACILITY:  [facility]   PHYSICIAN:  Sigmund I. Patsi Sears, M.D.         DATE OF BIRTH:  1917-01-06   DATE OF PROCEDURE:  DATE OF DISCHARGE:                                 OPERATIVE REPORT   PREOPERATIVE DIAGNOSIS:  Recurrent urethral trauma.   POSTOPERATIVE DIAGNOSIS:  Recurrent urethral trauma.   OPERATION:  1. Cystourethroscopy.  2. Suprapubic tube placement.   SURGEON:  Sigmund I. Patsi Sears, M.D.   ANESTHESIA:  General endotracheal.   PREPARATION:  After appropriate preanesthesia, the patient is brought to the  operating room and placed on the operating table in the dorsal supine  position where general endotracheal anesthesia was introduced.   PATIENTS HISTORY:  The patient is 15 years status post radical prostatectomy  with good urinary continence.  The patient had urethral trauma prior to knee  replacement, with apparent catheter distension in the urethra.  Foley  catheter was placed with cystoscopy and guidewire and following this, the  patient had repeat attempts at Foley removal and recurrent catheterization.  He finally underwent Urolume stent placement between the bladder neck and  the sphincter.  The patient voided well after that but then once restarted  on Coumadin, he developed bleeding and clot retention and then had catheter  placed in the emergency room on Tuesday morning.  This was placed rather  traumatically, and cystoscopy on Tuesday could not find the normal urethra.  Therefore, the patient was admitted to the hospital and is now for  cystoscopy and suprapubic placement.   DESCRIPTION OF PROCEDURE:  With the patient in supine and lithotomy  position, the pubis was prepped with Betadine solution and draped in the  usual fashion.  The 17 cystoscope was passed within  the urethra, and  urethral trauma is noted.  The stent is identified within the proximal  urethra, and I cannot tell if the stent has been pushed back into the  bladder, but it is a suspicion.  However, the fluid is clear, and the stent  appears to be developing ingrowth in a normal way.  Therefore, I am going to  leave the stent in position and place an SP tube.   The patient had suprapubic placement in the standard fashion with a  suprapubic catheter set.  He was then awakened and taken to the recovery  room in good condition.                                               Sigmund I. Patsi Sears, M.D.    SIT/MEDQ  D:  03/23/2002  T:  03/23/2002  Job:  [id number]   cc:   G.  Dorene Grebe, M.D.  [address]  [place]  [place] [address]  Fax: [phone]

## 2010-12-13 ENCOUNTER — Encounter: Payer: Self-pay | Admitting: *Deleted

## 2010-12-16 ENCOUNTER — Ambulatory Visit (INDEPENDENT_AMBULATORY_CARE_PROVIDER_SITE_OTHER): Payer: Medicare HMO | Admitting: Cardiovascular Disease

## 2010-12-16 ENCOUNTER — Encounter: Payer: Self-pay | Admitting: Cardiovascular Disease

## 2010-12-16 VITALS — BP 132/71 | HR 64 | Ht 70.0 in | Wt 211.8 lb

## 2010-12-16 DIAGNOSIS — I2510 Atherosclerotic heart disease of native coronary artery without angina pectoris: Secondary | ICD-10-CM

## 2010-12-16 DIAGNOSIS — I25810 Atherosclerosis of coronary artery bypass graft(s) without angina pectoris: Secondary | ICD-10-CM

## 2010-12-16 NOTE — Assessment & Plan Note (Signed)
Stable. He is doing well. He is on good medications. BP and lipids are at goal (followed in primary care). No changes today. I will see him back in one year.

## 2010-12-16 NOTE — Patient Instructions (Signed)
Your physician wants you to follow-up in:  12 months.  You will receive a reminder letter in the mail two months in advance. If you don't receive a letter, please call our office to schedule the follow-up appointment.   

## 2010-12-16 NOTE — Progress Notes (Signed)
History of Present Illness: 77 yo WM with history of CAD s/p 5V  CABG 2000, HLD, HTN, DJD here today for cardiac follow up. He has been followed in the past by Dr. Juanda Chance and was last seen here in our office in December 2011. He had bypass surgery in 2000. He had a catheterization done in 2007 which showed patent vein grafts after having an abnormal Myoview scan. His last echocardiogram was in June of 2008 at which time his ejection fraction was 55-60% and he had mild aortic stenosis.    He has been doing quite well has had no recent chest pain,  shortness of breath or palpitations. Despite his age he still works in the garden and mows his lawn.  His wife of 72 years passed away in September 15, 2010.   His primary care is Dr. Tomasa Blase in [place]. His lipids are followed in primary care.     Past Medical History  Diagnosis Date  . Coronary artery disease   . Hyperlipidemia   . Hypertension   . Degenerative joint disease     Past Surgical History  Procedure Date  . Coronary artery bypass graft 2000    with patent grafts in 2004.  Good left ventricular function    Current Outpatient Prescriptions  Medication Sig Dispense Refill  . acetaminophen (TYLENOL) 325 MG tablet Take 650 mg by mouth every 6 (six) hours as needed.        Marland Kitchen aspirin EC 81 MG tablet Take 81 mg by mouth daily.        . Cyanocobalamin 1000 MCG/15ML LIQD Take by mouth.        . furosemide (LASIX) 20 MG tablet Take 20 mg by mouth daily.        . Garlic Oil 1000 MG CAPS Take by mouth.        Marland Kitchen HYDROmorphone (DILAUDID) 2 MG tablet Take 2 mg by mouth every 4 (four) hours as needed.        . metoprolol succinate (TOPROL-XL) 25 MG 24 hr tablet Take 25 mg by mouth daily.        . Multiple Vitamin (MULTIVITAMIN) tablet Take 1 tablet by mouth daily.        Marland Kitchen omeprazole (PRILOSEC) 20 MG capsule Take 20 mg by mouth daily.        . potassium chloride SA (K-DUR,KLOR-CON) 20 MEQ tablet Take 10 mEq by mouth daily.        . simvastatin  (ZOCOR) 80 MG tablet Take 40 mg by mouth at bedtime.        . vitamin E 400 UNIT capsule Take 400 Units by mouth daily.          Allergies not on file  History   Social History  . Marital Status: Married    Spouse Name: N/A    Number of Children: N/A  . Years of Education: N/A   Occupational History  . retired    Social History Main Topics  . Smoking status: Unknown If Ever Smoked  . Smokeless tobacco: Not on file  . Alcohol Use: Not on file  . Drug Use: Not on file  . Sexually Active: Not on file   Other Topics Concern  . Not on file   Social History Narrative  . No narrative on file    Family History  Problem Relation Age of Onset  . Heart failure Mother     Age 81  . Stroke Father  Age 40    Review of Systems:  As stated in the HPI and otherwise negative.   BP 132/71  Pulse 64  Ht 5\' 10"  (1.778 m)  Wt 211 lb 12.8 oz (96.072 kg)  BMI 30.39 kg/m2  Physical Examination: General: Well developed, well nourished, NAD HEENT: OP clear, mucus membranes moist SKIN: warm, dry. No rashes. Neuro: No focal deficits Musculoskeletal: Muscle strength 5/5 all ext Psychiatric: Mood and affect normal Neck: No JVD, no carotid bruits, no thyromegaly, no lymphadenopathy. Lungs:Clear bilaterally, no wheezes, rhonci, crackles Cardiovascular: Regular rate and rhythm. No murmurs, gallops or rubs. Abdomen:Soft. Bowel sounds present. Non-tender.  Extremities: No lower extremity edema. Pulses are 2 + in the bilateral DP/PT.  EKG:NSR, rate 62. 1st degree AV block. LVH. Old inferior Q waves.

## 2011-12-18 ENCOUNTER — Ambulatory Visit (INDEPENDENT_AMBULATORY_CARE_PROVIDER_SITE_OTHER): Payer: Medicare Other | Admitting: Cardiovascular Disease

## 2011-12-18 ENCOUNTER — Encounter: Payer: Self-pay | Admitting: Cardiovascular Disease

## 2011-12-18 VITALS — BP 138/70 | HR 83 | Ht 70.0 in | Wt 221.0 lb

## 2011-12-18 DIAGNOSIS — I4891 Unspecified atrial fibrillation: Secondary | ICD-10-CM

## 2011-12-18 DIAGNOSIS — I2510 Atherosclerotic heart disease of native coronary artery without angina pectoris: Secondary | ICD-10-CM

## 2011-12-18 LAB — BASIC METABOLIC PANEL
BUN: 23 mg/dL (ref 6–23)
CO2: 26 mEq/L (ref 19–32)
Calcium: 9.1 mg/dL (ref 8.4–10.5)
Chloride: 103 mEq/L (ref 96–112)
Creatinine, Ser: 1.2 mg/dL (ref 0.4–1.5)
GFR: 60.59 mL/min (ref >60.00)
Glucose, Bld: 103 mg/dL — ABNORMAL HIGH (ref 70–99)
Potassium: 4.4 mEq/L (ref 3.5–5.1)
Sodium: 137 mEq/L (ref 135–145)

## 2011-12-18 LAB — CBC WITH DIFFERENTIAL/PLATELET
Basophils Absolute: 0 10*3/uL (ref 0.0–0.1)
Basophils Relative: 0.5 % (ref 0.0–3.0)
Eosinophils Absolute: 0.3 10*3/uL (ref 0.0–0.7)
Eosinophils Relative: 5.4 % — ABNORMAL HIGH (ref 0.0–5.0)
HCT: 37.8 % — ABNORMAL LOW (ref 39.0–52.0)
Hemoglobin: 12.4 g/dL — ABNORMAL LOW (ref 13.0–17.0)
Lymphocytes Relative: 17.3 % (ref 12.0–46.0)
Lymphs Abs: 1.1 10*3/uL (ref 0.7–4.0)
MCHC: 32.7 g/dL (ref 30.0–36.0)
MCV: 98.1 fl (ref 78.0–100.0)
Monocytes Absolute: 0.8 10*3/uL (ref 0.1–1.0)
Monocytes Relative: 12 % (ref 3.0–12.0)
Neutro Abs: 4.1 10*3/uL (ref 1.4–7.7)
Neutrophils Relative %: 64.8 % (ref 43.0–77.0)
Platelets: 178 10*3/uL (ref 150.0–400.0)
RBC: 3.86 Mil/uL — ABNORMAL LOW (ref 4.22–5.81)
RDW: 13 % (ref 11.5–14.6)
WBC: 6.3 10*3/uL (ref 4.5–10.5)

## 2011-12-18 MED ORDER — RIVAROXABAN 20 MG PO TABS: 20.0000 mg | ORAL_TABLET | Freq: Every day | ORAL | Status: DC

## 2011-12-18 NOTE — Patient Instructions (Addendum)
Your physician recommends that you schedule a follow-up appointment in:  6 weeks with Dr. Clifton James  Your physician recommends that you schedule a follow-up appointment in:  4 weeks with [facility] in our office for monitoring of Xarelto  Your physician has recommended you make the following change in your medication:  Start Xarelto 20 mg by mouth daily. Stop Aspirin.

## 2011-12-18 NOTE — Progress Notes (Signed)
History of Present Illness: 78 yo WM with history of CAD s/p 5V CABG 2000, HLD, HTN, DJD here today for cardiac follow up. He has been followed in the past by Dr. Juanda Chance and was last seen here in our office in December 2012. He had bypass surgery in 2000. He had a catheterization done in 2007 which showed patent vein grafts after having an abnormal Myoview scan. His last echocardiogram was in June of 2008 at which time his ejection fraction was 55-60% and he had mild aortic stenosis.   He has been doing quite well has had no recent chest pain, shortness of breath or palpitations. Despite his age he still works in the garden and mows his lawn.  His wife of 72 years passed away in 09-12-10.   Primary Care Physician: Dr. Tomasa Blase in [place]  Last Lipid Profile: Followed in primary care.    Past Medical History  Diagnosis Date  . Coronary artery disease   . Hyperlipidemia   . Hypertension   . Degenerative joint disease     Past Surgical History  Procedure Date  . Coronary artery bypass graft 2000    with patent grafts in 2004.  Good left ventricular function  . Broken right leg   . Prostatectomy   . Hernia repair     multiple    Current Outpatient Prescriptions  Medication Sig Dispense Refill  . acetaminophen (TYLENOL) 325 MG tablet Take 650 mg by mouth every 6 (six) hours as needed.        Marland Kitchen aspirin EC 81 MG tablet Take 81 mg by mouth daily.        . cyanocobalamin 1000 MCG tablet Take 100 mcg by mouth daily.        . furosemide (LASIX) 20 MG tablet Take 20 mg by mouth as needed.       . Garlic Oil 1000 MG CAPS Take by mouth.        Marland Kitchen HYDROmorphone (DILAUDID) 2 MG tablet Take 2 mg by mouth every 4 (four) hours as needed.        . Ipratropium-Albuterol (COMBIVENT RESPIMAT) 20-100 MCG/ACT AERS respimat Inhale into the lungs every 6 (six) hours.      . metoprolol tartrate (LOPRESSOR) 25 MG tablet Take 1/2 tab twice a day      . Multiple Vitamin (MULTIVITAMIN) tablet Take 1  tablet by mouth daily.        Marland Kitchen omeprazole (PRILOSEC) 20 MG capsule Take 20 mg by mouth daily.        . simvastatin (ZOCOR) 80 MG tablet Take 40 mg by mouth at bedtime.        . vitamin E 400 UNIT capsule Take 400 Units by mouth daily.          Allergies  Allergen Reactions  . Codeine     History   Social History  . Marital Status: Married    Spouse Name: N/A    Number of Children: 4  . Years of Education: N/A   Occupational History  . 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 Smoking status: Former Smoker    Quit date: 09/13/1949  . Smokeless tobacco: Not on file  . Alcohol Use: No  . Drug Use: No  . Sexually Active: Not on file   Other Topics Concern  . Not on file   Social History Narrative  . No narrative on file    Family History  Problem Relation  Age of Onset  . Heart failure Mother     Age 42  . Stroke Father     Age 59    Review of Systems:  As stated in the HPI and otherwise negative.   BP 138/70  Pulse 83  Ht 5\' 10"  (1.778 m)  Wt 221 lb (100.245 kg)  BMI 31.71 kg/m2  Physical Examination: General: Well developed, well nourished, NAD HEENT: OP clear, mucus membranes moist SKIN: warm, dry. No rashes. Neuro: No focal deficits Musculoskeletal: Muscle strength 5/5 all ext Psychiatric: Mood and affect normal Neck: No JVD, no carotid bruits, no thyromegaly, no lymphadenopathy. Lungs:Clear bilaterally, no wheezes, rhonci, crackles Cardiovascular: Irreg irreg. No murmurs, gallops or rubs. Abdomen:Soft. Bowel sounds present. Non-tender.  Extremities: No lower extremity edema. Pulses are 2 + in the bilateral DP/PT.  EKG: Atrial fibrillation, rate 83 bpm.   Assessment and Plan:   1. CAD: Stable. He is doing well. He is on good medications. BP and lipids are at goal (followed in primary care).  2. Atrial fibrillation: New diagnosis today. Rate is controlled. Continue beta blocker for rate control.  He thinks he was told about  this in the past. He has not been on anti-coagulation in the past. I have discussed the risk of stroke. Will start Xarelto 20 mg po Qdaily. Risk of bleeding reviewed. Will check BMET, CBC today. F/U coumadin clinic 1 month. I will see in f/u in 6 weeks. Will discuss repeat echo at f/u visit.

## 2012-01-15 ENCOUNTER — Ambulatory Visit (INDEPENDENT_AMBULATORY_CARE_PROVIDER_SITE_OTHER): Payer: Medicare Other | Admitting: *Deleted

## 2012-01-15 DIAGNOSIS — I4891 Unspecified atrial fibrillation: Secondary | ICD-10-CM

## 2012-01-15 LAB — CBC
HCT: 35.4 % — ABNORMAL LOW (ref 39.0–52.0)
Hemoglobin: 11.9 g/dL — ABNORMAL LOW (ref 13.0–17.0)
MCV: 96.9 fl (ref 78.0–100.0)
RDW: 13 % (ref 11.5–14.6)
WBC: 7.9 10*3/uL (ref 4.5–10.5)

## 2012-01-15 LAB — BASIC METABOLIC PANEL
CO2: 28 mEq/L (ref 19–32)
Calcium: 9 mg/dL (ref 8.4–10.5)
Creatinine, Ser: 1.5 mg/dL (ref 0.4–1.5)
Glucose, Bld: 89 mg/dL (ref 70–99)

## 2012-01-15 NOTE — Progress Notes (Signed)
Pt was started on Xarelto  20 mg daily  for Atrial Fib  on 12/19/2011.    Reviewed patients medication list.  Pt is not currently on any combined P-gp and strong CYP3A4 inhibitors/inducers (ketoconazole, traconazole, ritonavir, carbamazepine, phenytoin, rifampin, St. John's wort).  Reviewed labs.  SCr 1.5, Weight  221.8 ( 100.8kg), CrCl-43.86.  Dose changed to Xarelto 15mg  daily per results of labs on  01/15/2012 and based on CrCl 43.86..   Hgb 11.9 and HCT 35.4  A full discussion of the nature of anticoagulants has been carried out.  A benefit/risk analysis has been presented to the patient, so that they understand the justification for choosing anticoagulation with Xarelto at this time.  The need for compliance is stressed.  Pt is aware to take the medication once daily with the largest meal of the day.  Side effects of potential bleeding are discussed, including unusual colored urine or stools, coughing up blood or coffee ground emesis, nose bleeds or serious fall or head trauma.  Discussed signs and symptoms of stroke. The patient should avoid any OTC items containing aspirin or ibuprofen.  Avoid alcohol consumption.   Call if any signs of abnormal bleeding.  Discussed financial obligations and resolved any difficulty in obtaining medication.  Will have cbc and bmet today then recheck in 6 months Pt obtains meds from [place] and states he may have to change this medication but will discuss this  when sees Dr Clifton James  later this month, Pt does have enough Xarelto until sees MD. Pt called and given results of labs done on  01/15/2012 and that dose of Xarelto changed to 15mg  daily and informed that will have samples of Xarelto  15mg  daily for pt until sees Dr on January 23rd. Pt verbalized understanding and states will be there this afternoon to obtain samples of Xarelto 15mg .

## 2012-01-16 ENCOUNTER — Telehealth: Payer: Self-pay | Admitting: *Deleted

## 2012-01-16 ENCOUNTER — Other Ambulatory Visit: Payer: Self-pay | Admitting: *Deleted

## 2012-01-16 DIAGNOSIS — I4891 Unspecified atrial fibrillation: Secondary | ICD-10-CM

## 2012-01-16 MED ORDER — RIVAROXABAN 15 MG PO TABS: 15.0000 mg | ORAL_TABLET | Freq: Every day | ORAL | Status: DC

## 2012-01-16 NOTE — Telephone Encounter (Signed)
Talked with pt gave him results of labs and because of this dose of Xarelto  needs to be changed to 15 mg daily.Pt also instructed that can come to clinic and will have samples  of Xarelto 15mg  to give him until sees MD later this month and pt states understanding and that he will come for the Xarelto later today.

## 2012-01-16 NOTE — Telephone Encounter (Signed)
Message copied by Jeannine Kitten on Fri Jan 16, 2012  8:30 AM ------      Message from: Verne Carrow D      Created: Thu Jan 15, 2012  4:59 PM       Dennie Bible, Labs checked in coumadin clinic today. His creatinine clearance is now 43 (it was 54 one month ago).  Based on recommendations, his Xarelto dose should be 15 mg per day if his creatinine clearance is below 50. Can we call the patient and have him come by tomorrow for samples of 15 mg per day (30 day supply) and I will discuss with him at f/u visit? Thanks, chris

## 2012-02-05 ENCOUNTER — Encounter: Payer: Self-pay | Admitting: Cardiovascular Disease

## 2012-02-05 ENCOUNTER — Other Ambulatory Visit: Payer: Self-pay | Admitting: *Deleted

## 2012-02-05 ENCOUNTER — Ambulatory Visit (INDEPENDENT_AMBULATORY_CARE_PROVIDER_SITE_OTHER): Payer: Medicare Other | Admitting: Cardiovascular Disease

## 2012-02-05 VITALS — BP 130/70 | HR 73 | Ht 70.0 in | Wt 218.8 lb

## 2012-02-05 DIAGNOSIS — I4891 Unspecified atrial fibrillation: Secondary | ICD-10-CM

## 2012-02-05 DIAGNOSIS — I25810 Atherosclerosis of coronary artery bypass graft(s) without angina pectoris: Secondary | ICD-10-CM

## 2012-02-05 MED ORDER — RIVAROXABAN 15 MG PO TABS: 15.0000 mg | ORAL_TABLET | Freq: Every day | ORAL | Status: DC

## 2012-02-05 NOTE — Addendum Note (Signed)
Addended by: Dossie Arbour on: 02/05/2012 11:20 AM   Modules accepted: Orders

## 2012-02-05 NOTE — Progress Notes (Signed)
History of Present Illness: 79 yo WM with history of CAD s/p 5V CABG 2000, HLD, HTN, DJD here today for cardiac follow up. He has been followed in the past by Dr. Juanda Chance. He had bypass surgery in 2000. He had a catheterization done in 2007 which showed patent vein grafts after having an abnormal Myoview scan. His last echocardiogram was in June of 2008 at which time his ejection fraction was 55-60% and he had mild aortic stenosis. At the last visit in December 2013 he was found to be in atrial fibrillation. He was started on Xarelto 15 mg po Qaily to reduce risk of CVA.   He has been doing quite well has had no recent chest pain, shortness of breath or palpitations. Despite his age he still works in the garden and mows his lawn.  He has had no issues with bleeding. He feels well.   Primary Care Physician: Dr. Tomasa Blase in [place] and the [facility].   Last Lipid Profile: Followed in primary care.    Past Medical History  Diagnosis Date  . Coronary artery disease   . Hyperlipidemia   . Hypertension   . Degenerative joint disease     Past Surgical History  Procedure Date  . Coronary artery bypass graft 2000    with patent grafts in 2004.  Good left ventricular function  . Broken right leg   . Prostatectomy   . Hernia repair     multiple    Current Outpatient Prescriptions  Medication Sig Dispense Refill  . acetaminophen (TYLENOL) 325 MG tablet Take 650 mg by mouth every 6 (six) hours as needed.        . cyanocobalamin 1000 MCG tablet Take 100 mcg by mouth daily.        . furosemide (LASIX) 20 MG tablet Take 20 mg by mouth as needed.       . Garlic Oil 1000 MG CAPS Take by mouth.        Marland Kitchen HYDROmorphone (DILAUDID) 2 MG tablet Take 2 mg by mouth every 4 (four) hours as needed.        . Ipratropium-Albuterol (COMBIVENT RESPIMAT) 20-100 MCG/ACT AERS respimat Inhale into the lungs every 6 (six) hours as needed.       . metoprolol tartrate (LOPRESSOR) 25 MG tablet Take 1/2 tab twice a  day      . Multiple Vitamin (MULTIVITAMIN) tablet Take 1 tablet by mouth daily.        Marland Kitchen omeprazole (PRILOSEC) 20 MG capsule Take 20 mg by mouth daily.        . Rivaroxaban (XARELTO) 15 MG TABS tablet Take 1 tablet (15 mg total) by mouth daily.  30 tablet  6  . simvastatin (ZOCOR) 80 MG tablet Take 40 mg by mouth at bedtime.        . vitamin E 400 UNIT capsule Take 400 Units by mouth daily.          Allergies  Allergen Reactions  . Codeine     History   Social History  . Marital Status: Married    Spouse Name: N/A    Number of Children: 4  . Years of Education: N/A   Occupational History  . 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 Smoking status: Former Smoker    Quit date: 09/13/1949  . Smokeless tobacco: Not on file  . Alcohol Use: No  . Drug Use: No  . Sexually Active: Not on file  Other Topics Concern  . Not on file   Social History Narrative  . No narrative on file    Family History  Problem Relation Age of Onset  . Heart failure Mother     Age 43  . Stroke Father     Age 83    Review of Systems:  As stated in the HPI and otherwise negative.   BP 130/70  Pulse 73  Ht 5\' 10"  (1.778 m)  Wt 218 lb 12.8 oz (99.247 kg)  BMI 31.39 kg/m2  SpO2 99%  Physical Examination: General: Well developed, well nourished, NAD HEENT: OP clear, mucus membranes moist SKIN: warm, dry. No rashes. Neuro: No focal deficits Musculoskeletal: Muscle strength 5/5 all ext Psychiatric: Mood and affect normal Neck: No JVD, no carotid bruits, no thyromegaly, no lymphadenopathy. Lungs:Clear bilaterally, no wheezes, rhonci, crackles Cardiovascular: Irregular. No murmurs, gallops or rubs. Abdomen:Soft. Bowel sounds present. Non-tender.  Extremities: No lower extremity edema. Pulses are 2 + in the bilateral DP/PT.  EKG: Atrial fibrillation, rate 72 bpm. LVH. Possible old anterior MI.   Assessment and Plan:   1. CAD: Stable. He is doing well. He is on good  medications. BP and lipids are at goal (followed in primary care).   2. Atrial fibrillation: New diagnosis last visit in December 2013.  Rate is controlled. Continue beta blocker for rate control. He was started on Xarelto at the last visit.  This costs about 40 dollars per month with his insurance. He has been getting samples of Xarelto. [facility] see what anti-coagulant is on formulary. Will continue Xarelto for now. Needs repeat echo. Will arrange repeat echo today.   I will see him back in 3 months.

## 2012-02-05 NOTE — Patient Instructions (Addendum)
Your physician recommends that you schedule a follow-up appointment in:  3-4 months.   Your physician has requested that you have an echocardiogram. Echocardiography is a painless test that uses sound waves to create images of your heart. It provides your doctor with information about the size and shape of your heart and how well your heart's chambers and valves are working. This procedure takes approximately one hour. There are no restrictions for this procedure.  Please schedule an appt with [facility]in Clinic for Xarelto monitoring . To be done on February 3 or 4.

## 2012-02-16 ENCOUNTER — Other Ambulatory Visit (HOSPITAL_COMMUNITY): Payer: Medicare Other

## 2012-02-16 ENCOUNTER — Ambulatory Visit (INDEPENDENT_AMBULATORY_CARE_PROVIDER_SITE_OTHER): Payer: Medicare Other

## 2012-02-16 ENCOUNTER — Ambulatory Visit (HOSPITAL_COMMUNITY): Payer: Medicare Other | Attending: Cardiovascular Disease | Admitting: Radiology

## 2012-02-16 DIAGNOSIS — I25810 Atherosclerosis of coronary artery bypass graft(s) without angina pectoris: Secondary | ICD-10-CM

## 2012-02-16 DIAGNOSIS — I359 Nonrheumatic aortic valve disorder, unspecified: Secondary | ICD-10-CM | POA: Insufficient documentation

## 2012-02-16 DIAGNOSIS — Z7901 Long term (current) use of anticoagulants: Secondary | ICD-10-CM

## 2012-02-16 DIAGNOSIS — E785 Hyperlipidemia, unspecified: Secondary | ICD-10-CM | POA: Insufficient documentation

## 2012-02-16 DIAGNOSIS — I2510 Atherosclerotic heart disease of native coronary artery without angina pectoris: Secondary | ICD-10-CM | POA: Insufficient documentation

## 2012-02-16 DIAGNOSIS — I4891 Unspecified atrial fibrillation: Secondary | ICD-10-CM

## 2012-02-16 DIAGNOSIS — Z87891 Personal history of nicotine dependence: Secondary | ICD-10-CM | POA: Insufficient documentation

## 2012-02-16 DIAGNOSIS — I10 Essential (primary) hypertension: Secondary | ICD-10-CM | POA: Insufficient documentation

## 2012-02-16 LAB — BASIC METABOLIC PANEL
BUN: 26 mg/dL — ABNORMAL HIGH (ref 6–23)
Chloride: 104 mEq/L (ref 96–112)
Potassium: 4.6 mEq/L (ref 3.5–5.1)

## 2012-02-16 LAB — CBC
HCT: 37 % — ABNORMAL LOW (ref 39.0–52.0)
Hemoglobin: 12.2 g/dL — ABNORMAL LOW (ref 13.0–17.0)
RBC: 3.83 Mil/uL — ABNORMAL LOW (ref 4.22–5.81)
WBC: 6.9 10*3/uL (ref 4.5–10.5)

## 2012-02-16 NOTE — Progress Notes (Signed)
Pt was started on Xarelto 15mg  daily for atrial fibrillation On 12/18/11   Reviewed patients medication list.  Pt is not currently on any combined P-gp and strong CYP3A4 inhibitors/inducers (ketoconazole, traconazole, ritonavir, carbamazepine, phenytoin, rifampin, St. John's wort).  Reviewed labs.  SCr 1.3 , Weight 99kg , CrCl- 67ml/min. Xarelto 15mg  daily dose is appropriate based on CrCl.   Hgb12 and HCT 37 He just started on Amoxicillin x7 days for tooth abcess and I assured him this antibiotic dose not affect his Xarelto.  He may need a root canal and is unsure if he will need to come off Xarelto.  He will call the office to follow up dental plan.  A full discussion of the nature of anticoagulants has been carried out.  A benefit/risk analysis has been presented to the patient, so that they understand the justification for choosing anticoagulation with Xarelto at this time.  The need for compliance is stressed.  Pt is aware to take the medication once daily with the largest meal of the day.  Side effects of potential bleeding are discussed, including unusual colored urine or stools, coughing up blood or coffee ground emesis, nose bleeds or serious fall or head trauma.  Discussed signs and symptoms of stroke. The patient should avoid any OTC items containing aspirin or ibuprofen.  Avoid alcohol consumption.   Call if any signs of abnormal bleeding.  Discussed financial obligations and resolved any difficulty in obtaining medication.  Next lab test test in 6 months which is  08/18/12 at 5 - Wed

## 2012-02-16 NOTE — Patient Instructions (Addendum)

## 2012-02-16 NOTE — Progress Notes (Signed)
Echocardiogram performed.  

## 2012-02-17 ENCOUNTER — Other Ambulatory Visit (HOSPITAL_COMMUNITY): Payer: Medicare Other

## 2012-02-18 ENCOUNTER — Telehealth: Payer: Self-pay | Admitting: Cardiovascular Disease

## 2012-02-18 NOTE — Telephone Encounter (Signed)
New Problem:    Patient returned your call regarding his recent lab results.  Please call back.

## 2012-02-18 NOTE — Telephone Encounter (Signed)
Spoke with pt and reviewed echo and lab results with him.  

## 2012-05-27 ENCOUNTER — Ambulatory Visit (INDEPENDENT_AMBULATORY_CARE_PROVIDER_SITE_OTHER): Payer: Medicare Other | Admitting: Cardiovascular Disease

## 2012-05-27 ENCOUNTER — Encounter: Payer: Self-pay | Admitting: Cardiovascular Disease

## 2012-05-27 VITALS — BP 112/72 | HR 86 | Ht 68.0 in | Wt 217.0 lb

## 2012-05-27 DIAGNOSIS — I2510 Atherosclerotic heart disease of native coronary artery without angina pectoris: Secondary | ICD-10-CM

## 2012-05-27 DIAGNOSIS — I4891 Unspecified atrial fibrillation: Secondary | ICD-10-CM

## 2012-05-27 NOTE — Progress Notes (Signed)
History of Present Illness: 79 yo WM with history of CAD s/p 5V CABG 2000, HLD, HTN, DJD here today for cardiac follow up. He has been followed in the past by Dr. Juanda Chance. He had bypass surgery in 2000. He had a catheterization done in 2007 which showed patent vein grafts after having an abnormal Myoview scan. His last echocardiogram was in June of 2008 at which time his ejection fraction was 55-60% and he had mild aortic stenosis. At his visit in December 2013 he was found to be in atrial fibrillation. He was started on Xarelto 15 mg po Qaily to reduce risk of CVA. He was last seen in our office January 2013.   He has been doing quite well has had no recent chest pain, shortness of breath or palpitations. Despite his age he still works in the garden and mows his lawn.  He has had no issues with bleeding. He feels well.   Primary Care Physician: Dr. Tomasa Blase ([place]) [facility] [place])  Last Lipid Profile: Followed in primary care.   Past Medical History  Diagnosis Date  . Coronary artery disease   . Hyperlipidemia   . Hypertension   . Degenerative joint disease     Past Surgical History  Procedure Laterality Date  . Coronary artery bypass graft  2000    with patent grafts in 2004.  Good left ventricular function  . Broken right leg    . Prostatectomy    . Hernia repair      multiple    Current Outpatient Prescriptions  Medication Sig Dispense Refill  . acetaminophen (TYLENOL) 325 MG tablet Take 650 mg by mouth every 6 (six) hours as needed.        . Cholecalciferol (VITAMIN D-3) 1000 UNITS CAPS Take by mouth 1 day or 1 dose.      . cyanocobalamin 1000 MCG tablet Take 100 mcg by mouth daily.        . furosemide (LASIX) 20 MG tablet Take 20 mg by mouth as needed.       . Garlic Oil 1000 MG CAPS Take by mouth.        Marland Kitchen HYDROmorphone (DILAUDID) 2 MG tablet Take 2 mg by mouth every 4 (four) hours as needed.        . Ipratropium-Albuterol (COMBIVENT RESPIMAT) 20-100 MCG/ACT AERS respimat  Inhale into the lungs every 6 (six) hours as needed.       . metoprolol tartrate (LOPRESSOR) 25 MG tablet Take 1/2 tab twice a day      . Multiple Vitamin (MULTIVITAMIN) tablet Take 1 tablet by mouth daily.        Marland Kitchen omeprazole (PRILOSEC) 20 MG capsule Take 20 mg by mouth daily.        . Rivaroxaban (XARELTO) 15 MG TABS tablet Take 1 tablet (15 mg total) by mouth daily.  30 tablet  6  . simvastatin (ZOCOR) 80 MG tablet Take 40 mg by mouth at bedtime.        . vitamin E 400 UNIT capsule Take 400 Units by mouth daily.         No current facility-administered medications for this visit.    Allergies  Allergen Reactions  . Codeine     History   Social History  . Marital Status: Married    Spouse Name: N/A    Number of Children: 4  . Years of Education: N/A   Occupational History  . retired-walker [employer]    Social History Main  Topics  . Smoking status: Former Smoker    Quit date: 09/13/1949  . Smokeless tobacco: Not on file  . Alcohol Use: No  . Drug Use: No  . Sexually Active: Not on file   Other Topics Concern  . Not on file   Social History Narrative  . No narrative on file    Family History  Problem Relation Age of Onset  . Heart failure Mother     Age 32  . Stroke Father     Age 41    Review of Systems:  As stated in the HPI and otherwise negative.   BP 112/72  Pulse 86  Ht 5\' 8"  (1.727 m)  Wt 217 lb (98.431 kg)  BMI 33 kg/m2  Physical Examination: General: Well developed, well nourished, NAD HEENT: OP clear, mucus membranes moist SKIN: warm, dry. No rashes. Neuro: No focal deficits Musculoskeletal: Muscle strength 5/5 all ext Psychiatric: Mood and affect normal Neck: No JVD, no carotid bruits, no thyromegaly, no lymphadenopathy. Lungs:Clear bilaterally, no wheezes, rhonci, crackles Cardiovascular: Irregular irregular No murmurs, gallops or rubs. Abdomen:Soft. Bowel sounds present. Non-tender.  Extremities: No lower extremity edema. Pulses are  2 + in the bilateral DP/PT.  EKG: Atrial fibrillation, rate 71 bpm. Old inferior infarct.   Echo 02/16/12: Left ventricle: The cavity size was normal. Wall thickness was increased in a pattern of mild LVH. There was focal basal hypertrophy. Systolic function was normal. The estimated ejection fraction was in the range of 60% to 65%. - Aortic valve: There was mild stenosis. - Left atrium: The atrium was moderately dilated. - Right atrium: The atrium was mildly dilated.  Assessment and Plan:   1. CAD: Stable. He is doing well. He is on good medications. BP and lipids are at goal (followed in primary care).   2. Atrial fibrillation: New diagnosis in December 2013. Rate controlled.  Continue beta blocker. Continue Xarelto for anticoagulation. Echo February 2014 with normal LV size and function. Biatrial enlargement. NO valvular disease.

## 2012-05-27 NOTE — Patient Instructions (Addendum)
Your physician wants you to follow-up in:  6 months. You will receive a reminder letter in the mail two months in advance. If you don't receive a letter, please call our office to schedule the follow-up appointment.   

## 2012-08-18 ENCOUNTER — Ambulatory Visit (INDEPENDENT_AMBULATORY_CARE_PROVIDER_SITE_OTHER): Payer: Medicare Other | Admitting: Pharmacist

## 2012-08-18 DIAGNOSIS — I4891 Unspecified atrial fibrillation: Secondary | ICD-10-CM

## 2012-08-18 LAB — BASIC METABOLIC PANEL
BUN: 17 mg/dL (ref 6–23)
CO2: 27 mEq/L (ref 19–32)
Calcium: 9 mg/dL (ref 8.4–10.5)
Creatinine, Ser: 1.3 mg/dL (ref 0.4–1.5)
Glucose, Bld: 94 mg/dL (ref 70–99)

## 2012-08-18 LAB — CBC WITH DIFFERENTIAL/PLATELET
Basophils Relative: 0.5 % (ref 0.0–3.0)
Eosinophils Relative: 3.6 % (ref 0.0–5.0)
HCT: 38.8 % — ABNORMAL LOW (ref 39.0–52.0)
Hemoglobin: 12.5 g/dL — ABNORMAL LOW (ref 13.0–17.0)
Lymphs Abs: 0.9 10*3/uL (ref 0.7–4.0)
MCV: 100.9 fl — ABNORMAL HIGH (ref 78.0–100.0)
Monocytes Relative: 13.6 % — ABNORMAL HIGH (ref 3.0–12.0)
Platelets: 169 10*3/uL (ref 150.0–400.0)
RBC: 3.85 Mil/uL — ABNORMAL LOW (ref 4.22–5.81)
WBC: 5.4 10*3/uL (ref 4.5–10.5)

## 2012-08-18 NOTE — Progress Notes (Signed)
Pt was started on Xarelto 15mg  daily for atrial fibrillation On 12/18/11   Reviewed patients medication list.  Pt is not currently on any combined P-gp and strong CYP3A4 inhibitors/inducers (ketoconazole, traconazole, ritonavir, carbamazepine, phenytoin, rifampin, St. John's wort).  Reviewed labs.  SCr 1.3 , Weight 98kg , CrCl- 74ml/min. Xarelto 15mg  daily dose is appropriate based on CrCl.   Hgb12.5 and HCT 38.8   A full discussion of the nature of anticoagulants has been carried out.  The need for compliance is stressed.  Pt is aware to take the medication once daily with the largest meal of the day- which is breakfast for him.  Side effects of potential bleeding are discussed, including unusual colored urine or stools, coughing up blood or coffee ground emesis, nose bleeds or serious fall or head trauma.  Discussed signs and symptoms of stroke. The patient should avoid any OTC items containing aspirin or ibuprofen.  Avoid alcohol consumption.   Call if any signs of abnormal bleeding.  Discussed financial obligations and resolved any difficulty in obtaining medication.  Next lab test test in 6 months

## 2012-09-30 ENCOUNTER — Telehealth: Payer: Self-pay | Admitting: Cardiovascular Disease

## 2012-09-30 NOTE — Telephone Encounter (Signed)
Spoke with pt. He is being scheduled for a spinal injection for back pain. Needs permission to stop Xarelto prior to injection.

## 2012-09-30 NOTE — Telephone Encounter (Signed)
Spoke with pt and gave him information from Dr. Clifton James. He does not know who the physician is that will be doing the injection but the phone number for the office is [phone]. [phone]. I spoke with [place] at this number and fax number for office is [phone]. I will fax this note to this number to the attention of Ortho.

## 2012-09-30 NOTE — Telephone Encounter (Signed)
OK to hold Xarelto for 2 days before procedure. cdm

## 2012-09-30 NOTE — Telephone Encounter (Signed)
New problem    Patient need to stop xarelto due to minor procedure.

## 2012-10-21 ENCOUNTER — Other Ambulatory Visit: Payer: Self-pay | Admitting: Cardiovascular Disease

## 2012-11-23 ENCOUNTER — Ambulatory Visit (INDEPENDENT_AMBULATORY_CARE_PROVIDER_SITE_OTHER): Payer: Medicare Other | Admitting: Cardiovascular Disease

## 2012-11-23 ENCOUNTER — Encounter: Payer: Self-pay | Admitting: Cardiovascular Disease

## 2012-11-23 VITALS — BP 122/58 | HR 82 | Ht 68.0 in | Wt 223.0 lb

## 2012-11-23 DIAGNOSIS — I2510 Atherosclerotic heart disease of native coronary artery without angina pectoris: Secondary | ICD-10-CM

## 2012-11-23 DIAGNOSIS — I5032 Chronic diastolic (congestive) heart failure: Secondary | ICD-10-CM

## 2012-11-23 DIAGNOSIS — I10 Essential (primary) hypertension: Secondary | ICD-10-CM

## 2012-11-23 DIAGNOSIS — I4891 Unspecified atrial fibrillation: Secondary | ICD-10-CM

## 2012-11-23 NOTE — Progress Notes (Signed)
History of Present Illness: 78 yo WM with history of CAD s/p 5V CABG 2000, HLD, HTN, DJD here today for cardiac follow up. He has been followed in the past by Dr. Juanda Chance. He had bypass surgery in 2000. He had a catheterization done in 2007 which showed patent vein grafts after having an abnormal Myoview scan. His last echocardiogram was in June of 2008 at which time his ejection fraction was 55-60% and he had mild aortic stenosis. At his visit in December 2013 he was found to be in atrial fibrillation. He was started on Xarelto 15 mg po Qaily to reduce risk of CVA. He was last seen in our office May 2014.  He is here today for follow up. He has been doing quite well. He has had no recent chest pain, shortness of breath or palpitations. He has had no issues with bleeding. He feels well.   Primary Care Physician: Dr. Tomasa Blase ([place]) [facility])  Last Lipid Profile: Followed in primary care.   Past Medical History  Diagnosis Date  . Coronary artery disease   . Hyperlipidemia   . Hypertension   . Degenerative joint disease     Past Surgical History  Procedure Laterality Date  . Coronary artery bypass graft  2000    with patent grafts in 2004.  Good left ventricular function  . Broken right leg    . Prostatectomy    . Hernia repair      multiple    Current Outpatient Prescriptions  Medication Sig Dispense Refill  . acetaminophen (TYLENOL) 325 MG tablet Take 650 mg by mouth every 6 (six) hours as needed.        Marland Kitchen alendronate (FOSAMAX) 70 MG tablet Take 70 mg by mouth every 7 (seven) days. Take with a full glass of water on an empty stomach.      . Cholecalciferol (VITAMIN D-3) 1000 UNITS CAPS Take by mouth 1 day or 1 dose.      . cyanocobalamin 1000 MCG tablet Take 1,000 mcg by mouth daily.       . furosemide (LASIX) 20 MG tablet Take 20 mg by mouth as needed.       . Garlic Oil 1000 MG CAPS Take by mouth.        . Ipratropium-Albuterol (COMBIVENT RESPIMAT) 20-100 MCG/ACT AERS  respimat Inhale into the lungs every 6 (six) hours as needed.       . metoprolol tartrate (LOPRESSOR) 25 MG tablet Take 1/2 tab twice a day      . omeprazole (PRILOSEC) 20 MG capsule Take 20 mg by mouth daily.        . potassium chloride SA (K-DUR,KLOR-CON) 20 MEQ tablet Take 20 mEq by mouth daily.      . simvastatin (ZOCOR) 80 MG tablet Take 40 mg by mouth at bedtime.        . tamsulosin (FLOMAX) 0.4 MG CAPS capsule Take 0.2 mg by mouth daily after supper.       . vitamin E 400 UNIT capsule Take 400 Units by mouth daily.        Carlena Hurl 15 MG TABS tablet TAKE ONE TABLET BY MOUTH ONE TIME DAILY  30 tablet  6   No current facility-administered medications for this visit.    Allergies  Allergen Reactions  . Codeine     History   Social History  . Marital Status: Married    Spouse Name: N/A    Number of Children: 4  .  Years of Education: N/A   Occupational History  . 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 Smoking status: Former Smoker    Quit date: 09/13/1949  . Smokeless tobacco: Not on file  . Alcohol Use: No  . Drug Use: No  . Sexual Activity: Not on file   Other Topics Concern  . Not on file   Social History Narrative  . No narrative on file    Family History  Problem Relation Age of Onset  . Heart failure Mother     Age 51  . Stroke Father     Age 67    Review of Systems:  As stated in the HPI and otherwise negative.   BP 122/58  Pulse 82  Ht 5\' 8"  (1.727 m)  Wt 223 lb (101.152 kg)  BMI 33.91 kg/m2  SpO2 97%  Physical Examination: General: Well developed, well nourished, NAD HEENT: OP clear, mucus membranes moist SKIN: warm, dry. No rashes. Neuro: No focal deficits Musculoskeletal: Muscle strength 5/5 all ext Psychiatric: Mood and affect normal Neck: No JVD, no carotid bruits, no thyromegaly, no lymphadenopathy. Lungs:Clear bilaterally, no wheezes, rhonci, crackles Cardiovascular: Irregular irregular No murmurs, gallops or  rubs. Abdomen:Soft. Bowel sounds present. Non-tender.  Extremities: No lower extremity edema. Pulses are 2 + in the bilateral DP/PT.  EKG: Atrial fibrillation, rate 71 bpm. Old inferior infarct.   Echo 02/16/12: Left ventricle: The cavity size was normal. Wall thickness was increased in a pattern of mild LVH. There was focal basal hypertrophy. Systolic function was normal. The estimated ejection fraction was in the range of 60% to 65%. - Aortic valve: There was mild stenosis. - Left atrium: The atrium was moderately dilated. - Right atrium: The atrium was mildly dilated.  Assessment and Plan:   1. CAD: Stable. He is doing well. He is on good medications. BP is at goal. Lipids followed in primary care.   2. Atrial fibrillation: New diagnosis in December 2013. Rate controlled. Continue beta blocker. Continue Xarelto for anticoagulation. Echo February 2014 with normal LV size and function. Biatrial enlargement. NO valvular disease.   3. Chronic diastolic CHF: Mild lower ext edema. Continue Lasix daily. He will use extra as needed. No dyspnea.   4. HTN: BP controlled. NO changes today.

## 2012-11-23 NOTE — Patient Instructions (Signed)
Your physician wants you to follow-up in:  6 months. You will receive a reminder letter in the mail two months in advance. If you don't receive a letter, please call our office to schedule the follow-up appointment.   

## 2012-12-02 ENCOUNTER — Telehealth: Payer: Self-pay | Admitting: Cardiology

## 2012-12-02 NOTE — Telephone Encounter (Signed)
Looks like this is Dr McAlhany's patient.  

## 2012-12-02 NOTE — Telephone Encounter (Signed)
Spoke with pt and gave him information from Dr. McAlhany 

## 2012-12-02 NOTE — Telephone Encounter (Signed)
He should stop Xarelto 2 days before his procedure. George [name]

## 2012-12-02 NOTE — Telephone Encounter (Signed)
New Problem  Pt will have spinal injections and will have to stop Xarelto// requests a call back to determine how many days he can discontinue intake of this medication// please call

## 2013-05-23 ENCOUNTER — Ambulatory Visit (INDEPENDENT_AMBULATORY_CARE_PROVIDER_SITE_OTHER): Payer: Commercial Managed Care - HMO | Admitting: Cardiovascular Disease

## 2013-05-23 ENCOUNTER — Encounter: Payer: Self-pay | Admitting: Cardiovascular Disease

## 2013-05-23 VITALS — BP 123/73 | HR 86 | Ht 69.0 in | Wt 221.0 lb

## 2013-05-23 DIAGNOSIS — I5032 Chronic diastolic (congestive) heart failure: Secondary | ICD-10-CM

## 2013-05-23 DIAGNOSIS — I2510 Atherosclerotic heart disease of native coronary artery without angina pectoris: Secondary | ICD-10-CM

## 2013-05-23 DIAGNOSIS — I10 Essential (primary) hypertension: Secondary | ICD-10-CM

## 2013-05-23 DIAGNOSIS — I4891 Unspecified atrial fibrillation: Secondary | ICD-10-CM

## 2013-05-23 NOTE — Patient Instructions (Signed)
Your physician wants you to follow-up in:  6 months. You will receive a reminder letter in the mail two months in advance. If you don't receive a letter, please call our office to schedule the follow-up appointment.   

## 2013-05-23 NOTE — Progress Notes (Signed)
History of Present Illness: 80 yo WM with history of CAD s/p 5V CABG 2000, HLD, HTN, DJD here today for cardiac follow up. He has been followed in the past by Dr. Olevia Perches. He had bypass surgery in 2000. He had a catheterization done in 2007 which showed patent vein grafts after having an abnormal Myoview scan. His last echocardiogram was in June of 2008 at which time his ejection fraction was 55-60% and he had mild aortic stenosis. At his visit in December 2013 he was found to be in atrial fibrillation. He was started on Xarelto 15 mg po Qaily to reduce risk of CVA. He was last seen in our office November 2014.  He is here today for follow up. He has been doing quite well. He has had no recent chest pain, shortness of breath or palpitations. He has had no issues with bleeding. He feels well.   Primary Care Physician: Dr. Delena Bali ([place]) [facility])  Last Lipid Profile: Followed in primary care.   Past Medical History  Diagnosis Date  . Coronary artery disease   . Hyperlipidemia   . Hypertension   . Degenerative joint disease     Past Surgical History  Procedure Laterality Date  . Coronary artery bypass graft  2000    with patent grafts in 2004.  Good left ventricular function  . Broken right leg    . Prostatectomy    . Hernia repair      multiple    Current Outpatient Prescriptions  Medication Sig Dispense Refill  . acetaminophen (TYLENOL) 325 MG tablet Take 650 mg by mouth every 6 (six) hours as needed.        Marland Kitchen alendronate (FOSAMAX) 70 MG tablet Take 70 mg by mouth every 7 (seven) days. Take with a full glass of water on an empty stomach.      . Cholecalciferol (VITAMIN D-3) 1000 UNITS CAPS Take by mouth 1 day or 1 dose.      . cyanocobalamin 1000 MCG tablet Take 1,000 mcg by mouth daily.       . furosemide (LASIX) 20 MG tablet Take 20 mg by mouth as needed.       . Garlic Oil 7829 MG CAPS Take by mouth.        . Ipratropium-Albuterol (COMBIVENT RESPIMAT) 20-100 MCG/ACT  AERS respimat Inhale into the lungs every 6 (six) hours as needed.       . metoprolol tartrate (LOPRESSOR) 25 MG tablet Take 1/2 tab twice a day      . omeprazole (PRILOSEC) 20 MG capsule Take 20 mg by mouth daily.        . potassium chloride SA (K-DUR,KLOR-CON) 20 MEQ tablet Take 20 mEq by mouth daily.      . simvastatin (ZOCOR) 80 MG tablet Take 40 mg by mouth at bedtime.        . tamsulosin (FLOMAX) 0.4 MG CAPS capsule Take 0.2 mg by mouth daily after supper.       . vitamin E 400 UNIT capsule Take 400 Units by mouth daily.        Alveda Reasons 15 MG TABS tablet TAKE ONE TABLET BY MOUTH ONE TIME DAILY  30 tablet  6   No current facility-administered medications for this visit.    Allergies  Allergen Reactions  . Codeine     History   Social History  . Marital Status: Married    Spouse Name: N/A    Number of Children: 4  .  Years of Education: N/A   Occupational History  . 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 Smoking status: Former Smoker    Quit date: 09/13/1949  . Smokeless tobacco: Not on file  . Alcohol Use: No  . Drug Use: No  . Sexual Activity: Not on file   Other Topics Concern  . Not on file   Social History Narrative  . No narrative on file    Family History  Problem Relation Age of Onset  . Heart failure Mother     Age 15  . Stroke Father     Age 74    Review of Systems:  As stated in the HPI and otherwise negative.   BP 123/73  Pulse 86  Ht 5\' 9"  (1.753 m)  Wt 221 lb (100.245 kg)  BMI 32.62 kg/m2  Physical Examination: General: Well developed, well nourished, NAD HEENT: OP clear, mucus membranes moist SKIN: warm, dry. No rashes. Neuro: No focal deficits Musculoskeletal: Muscle strength 5/5 all ext Psychiatric: Mood and affect normal Neck: No JVD, no carotid bruits, no thyromegaly, no lymphadenopathy. Lungs:Clear bilaterally, no wheezes, rhonci, crackles Cardiovascular: Irregular irregular No murmurs, gallops or  rubs. Abdomen:Soft. Bowel sounds present. Non-tender.  Extremities: No lower extremity edema. Pulses are 2 + in the bilateral DP/PT.  Echo 02/16/12: Left ventricle: The cavity size was normal. Wall thickness was increased in a pattern of mild LVH. There was focal basal hypertrophy. Systolic function was normal. The estimated ejection fraction was in the range of 60% to 65%. - Aortic valve: There was mild stenosis. - Left atrium: The atrium was moderately dilated. - Right atrium: The atrium was mildly dilated.  EKG: Atrial fib, rate 86 bpm. LVH. Old inferior infarct.   Assessment and Plan:   1. CAD: Stable. He is doing well. He is on good medications.  Lipids followed in primary care.   2. Atrial fibrillation: New diagnosis in December 2013. Rate controlled. Continue beta blocker. Continue Xarelto for anticoagulation. Echo February 2014 with normal LV size and function. Biatrial enlargement. No valvular disease.   3. Chronic diastolic CHF: Mild lower ext edema. Continue Lasix daily. He will use extra as needed. No dyspnea.   4. HTN: BP controlled. No changes today.

## 2013-07-14 ENCOUNTER — Other Ambulatory Visit: Payer: Self-pay | Admitting: Cardiovascular Disease

## 2013-11-21 ENCOUNTER — Ambulatory Visit (INDEPENDENT_AMBULATORY_CARE_PROVIDER_SITE_OTHER): Payer: Commercial Managed Care - HMO | Admitting: Cardiovascular Disease

## 2013-11-21 ENCOUNTER — Encounter: Payer: Self-pay | Admitting: Cardiovascular Disease

## 2013-11-21 VITALS — BP 130/72 | HR 58 | Ht 69.0 in | Wt 219.0 lb

## 2013-11-21 DIAGNOSIS — I10 Essential (primary) hypertension: Secondary | ICD-10-CM

## 2013-11-21 DIAGNOSIS — I482 Chronic atrial fibrillation, unspecified: Secondary | ICD-10-CM

## 2013-11-21 DIAGNOSIS — I5032 Chronic diastolic (congestive) heart failure: Secondary | ICD-10-CM

## 2013-11-21 DIAGNOSIS — I2510 Atherosclerotic heart disease of native coronary artery without angina pectoris: Secondary | ICD-10-CM

## 2013-11-21 MED ORDER — RIVAROXABAN 15 MG PO TABS: ORAL_TABLET | ORAL | Status: DC

## 2013-11-21 NOTE — Progress Notes (Signed)
History of Present Illness: 80 yo WM with history of CAD s/p 5V CABG 2000, HLD, HTN, DJD here today for cardiac follow up. He had bypass surgery in 2000. He had a catheterization done in 2007 which showed patent vein grafts after having an abnormal Myoview scan. His last echocardiogram was in June of 2008 at which time his ejection fraction was 55-60% and he had mild aortic stenosis. At his visit in December 2013 he was found to be in atrial fibrillation. He was started on Xarelto 15 mg po Qaily to reduce risk of CVA.   He is here today for follow up. He has been doing quite well. He has had no recent chest pain, shortness of breath or palpitations. He has had no issues with bleeding. He feels well. Occasional dizziness. On meclizine per primary care. No near syncope or syncope  Primary Care Physician: Dr. Delena Bali ([place]) [facility])  Last Lipid Profile: Followed in primary care.   Past Medical History  Diagnosis Date  . Coronary artery disease   . Hyperlipidemia   . Hypertension   . Degenerative joint disease   . Atrial fibrillation     Past Surgical History  Procedure Laterality Date  . Coronary artery bypass graft  2000    with patent grafts in 2004.  Good left ventricular function  . Broken right leg    . Prostatectomy    . Hernia repair      multiple    Current Outpatient Prescriptions  Medication Sig Dispense Refill  . acetaminophen (TYLENOL) 325 MG tablet Take 650 mg by mouth every 6 (six) hours as needed.      Marland Kitchen alendronate (FOSAMAX) 70 MG tablet Take 70 mg by mouth every 7 (seven) days. Take with a full glass of water on an empty stomach.    . Cholecalciferol (VITAMIN D-3) 1000 UNITS CAPS Take by mouth 1 day or 1 dose.    . cyanocobalamin 1000 MCG tablet Take 1,000 mcg by mouth daily.     . furosemide (LASIX) 20 MG tablet Take 20 mg by mouth as needed.     . Garlic Oil 5277 MG CAPS Take by mouth.      . Ipratropium-Albuterol (COMBIVENT RESPIMAT) 20-100 MCG/ACT AERS  respimat Inhale into the lungs every 6 (six) hours as needed.     . meclizine (ANTIVERT) 25 MG tablet Take 25 mg by mouth daily as needed for dizziness.    . metoprolol tartrate (LOPRESSOR) 25 MG tablet Take 1/2 tab twice a day    . omeprazole (PRILOSEC) 20 MG capsule Take 20 mg by mouth daily.      . potassium chloride SA (K-DUR,KLOR-CON) 20 MEQ tablet Take 20 mEq by mouth daily.    . simvastatin (ZOCOR) 80 MG tablet Take 40 mg by mouth at bedtime.      . tamsulosin (FLOMAX) 0.4 MG CAPS capsule Take 0.2 mg by mouth daily after supper.     . vitamin E 400 UNIT capsule Take 400 Units by mouth daily.      Alveda Reasons 15 MG TABS tablet TAKE 1 TABLET BY MOUTH EVERY DAY 30 tablet 3   No current facility-administered medications for this visit.    Allergies  Allergen Reactions  . Codeine     History   Social History  . Marital Status: Married    Spouse Name: N/A    Number of Children: 4  . Years of Education: N/A   Occupational History  . 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 Smoking status: Former Smoker    Quit date: 09/13/1949  . Smokeless tobacco: Not on file  . Alcohol Use: No  . Drug Use: No  . Sexual Activity: Not on file   Other Topics Concern  . Not on file   Social History Narrative    Family History  Problem Relation Age of Onset  . Heart failure Mother     Age 74  . Stroke Father     Age 89    Review of Systems:  As stated in the HPI and otherwise negative.   BP 130/72 mmHg  Pulse 58  Ht 5\' 9"  (1.753 m)  Wt 219 lb (99.338 kg)  BMI 32.33 kg/m2  Physical Examination: General: Well developed, well nourished, NAD HEENT: OP clear, mucus membranes moist SKIN: warm, dry. No rashes. Neuro: No focal deficits Musculoskeletal: Muscle strength 5/5 all ext Psychiatric: Mood and affect normal Neck: No JVD, no carotid bruits, no thyromegaly, no lymphadenopathy. Lungs:Clear bilaterally, no wheezes, rhonci, crackles Cardiovascular:  Irregular irregular No murmurs, gallops or rubs. Abdomen:Soft. Bowel sounds present. Non-tender.  Extremities: No lower extremity edema. Pulses are 2 + in the bilateral DP/PT.  Echo 02/16/12: Left ventricle: The cavity size was normal. Wall thickness was increased in a pattern of mild LVH. There was focal basal hypertrophy. Systolic function was normal. The estimated ejection fraction was in the range of 60% to 65%. - Aortic valve: There was mild stenosis. - Left atrium: The atrium was moderately dilated. - Right atrium: The atrium was mildly dilated.  Assessment and Plan:   1. CAD: Stable. He is doing well. He is on good medications.  Lipids followed in primary care.   2. Atrial fibrillation: Rate controlled. Continue beta blocker. Continue Xarelto for anticoagulation. Echo February 2014 with normal LV size and function. Biatrial enlargement. No valvular disease.   3. Chronic diastolic CHF: No lower ext edema. Continue Lasix daily. He will use extra as needed. No dyspnea.   4. HTN: BP controlled. No changes today.

## 2013-11-21 NOTE — Patient Instructions (Signed)
Your physician wants you to follow-up in:  6 months. You will receive a reminder letter in the mail two months in advance. If you don't receive a letter, please call our office to schedule the follow-up appointment.   

## 2014-03-25 DIAGNOSIS — J209 Acute bronchitis, unspecified: Secondary | ICD-10-CM | POA: Diagnosis not present

## 2014-04-10 DIAGNOSIS — M79609 Pain in unspecified limb: Secondary | ICD-10-CM | POA: Diagnosis not present

## 2014-04-10 DIAGNOSIS — I82512 Chronic embolism and thrombosis of left femoral vein: Secondary | ICD-10-CM | POA: Diagnosis not present

## 2014-04-10 DIAGNOSIS — R600 Localized edema: Secondary | ICD-10-CM | POA: Diagnosis not present

## 2014-04-17 DIAGNOSIS — Z1389 Encounter for screening for other disorder: Secondary | ICD-10-CM | POA: Diagnosis not present

## 2014-04-17 DIAGNOSIS — L03116 Cellulitis of left lower limb: Secondary | ICD-10-CM | POA: Diagnosis not present

## 2014-04-17 DIAGNOSIS — Z9181 History of falling: Secondary | ICD-10-CM | POA: Diagnosis not present

## 2014-04-17 DIAGNOSIS — I82502 Chronic embolism and thrombosis of unspecified deep veins of left lower extremity: Secondary | ICD-10-CM | POA: Diagnosis not present

## 2014-05-08 DIAGNOSIS — R609 Edema, unspecified: Secondary | ICD-10-CM | POA: Diagnosis not present

## 2014-05-08 DIAGNOSIS — M47816 Spondylosis without myelopathy or radiculopathy, lumbar region: Secondary | ICD-10-CM | POA: Diagnosis not present

## 2014-05-08 DIAGNOSIS — J309 Allergic rhinitis, unspecified: Secondary | ICD-10-CM | POA: Diagnosis not present

## 2014-05-08 DIAGNOSIS — I10 Essential (primary) hypertension: Secondary | ICD-10-CM | POA: Diagnosis not present

## 2014-05-08 DIAGNOSIS — I480 Paroxysmal atrial fibrillation: Secondary | ICD-10-CM | POA: Diagnosis not present

## 2014-05-08 DIAGNOSIS — Z6830 Body mass index (BMI) 30.0-30.9, adult: Secondary | ICD-10-CM | POA: Diagnosis not present

## 2014-05-08 DIAGNOSIS — J449 Chronic obstructive pulmonary disease, unspecified: Secondary | ICD-10-CM | POA: Diagnosis not present

## 2014-05-24 ENCOUNTER — Encounter: Payer: Self-pay | Admitting: Cardiovascular Disease

## 2014-05-24 ENCOUNTER — Ambulatory Visit (INDEPENDENT_AMBULATORY_CARE_PROVIDER_SITE_OTHER): Payer: Commercial Managed Care - HMO | Admitting: Cardiovascular Disease

## 2014-05-24 VITALS — BP 138/78 | HR 81 | Ht 66.0 in | Wt 212.4 lb

## 2014-05-24 DIAGNOSIS — I482 Chronic atrial fibrillation, unspecified: Secondary | ICD-10-CM

## 2014-05-24 DIAGNOSIS — I10 Essential (primary) hypertension: Secondary | ICD-10-CM

## 2014-05-24 DIAGNOSIS — I5032 Chronic diastolic (congestive) heart failure: Secondary | ICD-10-CM

## 2014-05-24 DIAGNOSIS — I2510 Atherosclerotic heart disease of native coronary artery without angina pectoris: Secondary | ICD-10-CM | POA: Diagnosis not present

## 2014-05-24 NOTE — Progress Notes (Signed)
Chief Complaint  Patient presents with  . Coronary Artery Disease    History of Present Illness: 81 yo WM with history of persistent atrial fibrillation, CAD s/p 5V CABG 2000, HLD, HTN, DJD here today for cardiac follow up. He had bypass surgery in 2000. He had a catheterization done in 2007 which showed patent vein grafts after having an abnormal Myoview scan. His last echocardiogram was in June of 2008 at which time his ejection fraction was 55-60% and he had mild aortic stenosis. At his visit in December 2013 he was found to be in atrial fibrillation. He was started on Xarelto 15 mg po Qaily to reduce risk of CVA.   He is here today for follow up. He has been doing quite well. He has had no recent chest pain, shortness of breath or palpitations. He has had no issues with bleeding. He feels well. No near syncope or syncope  Primary Care Physician: Dr. Delena Bali ([place]) [facility])  Last Lipid Profile: Followed in primary care.   Past Medical History  Diagnosis Date  . Coronary artery disease   . Hyperlipidemia   . Hypertension   . Degenerative joint disease   . Atrial fibrillation     Past Surgical History  Procedure Laterality Date  . Coronary artery bypass graft  2000    with patent grafts in 2004.  Good left ventricular function  . Broken right leg    . Prostatectomy    . Hernia repair      multiple    Current Outpatient Prescriptions  Medication Sig Dispense Refill  . acetaminophen (TYLENOL) 325 MG tablet Take 650 mg by mouth every 6 (six) hours as needed (for pain).     Marland Kitchen alendronate (FOSAMAX) 70 MG tablet Take 70 mg by mouth every 7 (seven) days. Take with a full glass of water on an empty stomach.    . Cholecalciferol (VITAMIN D-3) 1000 UNITS CAPS Take 1,000 Units by mouth daily.     . cyanocobalamin 1000 MCG tablet Take 1,000 mcg by mouth daily.     . furosemide (LASIX) 20 MG tablet Take 20 mg by mouth as needed (for swelling).     . Garlic Oil 7824 MG CAPS Take 1  capsule by mouth daily.     . Ipratropium-Albuterol (COMBIVENT RESPIMAT) 20-100 MCG/ACT AERS respimat Inhale 1 puff into the lungs every 6 (six) hours as needed (for wheezing).     . meclizine (ANTIVERT) 25 MG tablet Take 25 mg by mouth daily as needed for dizziness.    . metoprolol tartrate (LOPRESSOR) 25 MG tablet Take 12.5 mg by mouth 2 (two) times daily. Take 1/2 tab twice a day    . omeprazole (PRILOSEC) 20 MG capsule Take 20 mg by mouth daily.      . potassium chloride SA (K-DUR,KLOR-CON) 20 MEQ tablet Take 20 mEq by mouth daily.    . Rivaroxaban (XARELTO) 15 MG TABS tablet TAKE 1 TABLET BY MOUTH EVERY DAY 30 tablet 11  . simvastatin (ZOCOR) 80 MG tablet Take 40 mg by mouth at bedtime.      . tamsulosin (FLOMAX) 0.4 MG CAPS capsule Take 0.2 mg by mouth daily after supper.     . vitamin E 400 UNIT capsule Take 400 Units by mouth daily.       No current facility-administered medications for this visit.    Allergies  Allergen Reactions  . Codeine     History   Social History  . Marital Status:  Married    Spouse Name: N/A  . Number of Children: 4  . Years of Education: N/A   Occupational History  . retired-walker [employer] History Main Topics  . Smoking status: Former Smoker    Quit date: 09/13/1949  . Smokeless tobacco: Not on file  . Alcohol Use: No  . Drug Use: No  . Sexual Activity: Not on file   Other Topics Concern  . Not on file   Social History Narrative    Family History  Problem Relation Age of Onset  . Heart failure Mother     Age 85  . Stroke Father     Age 66    Review of Systems:  As stated in the HPI and otherwise negative.   BP 138/78 mmHg  Pulse 81  Ht 5\' 6"  (1.676 m)  Wt 212 lb 6.4 oz (96.344 kg)  BMI 34.30 kg/m2  Physical Examination: General: Well developed, well nourished, NAD HEENT: OP clear, mucus membranes moist SKIN: warm, dry. No rashes. Neuro: No focal deficits Musculoskeletal: Muscle strength 5/5 all  ext Psychiatric: Mood and affect normal Neck: No JVD, no carotid bruits, no thyromegaly, no lymphadenopathy. Lungs:Clear bilaterally, no wheezes, rhonci, crackles Cardiovascular: Irregular irregular No murmurs, gallops or rubs. Abdomen:Soft. Bowel sounds present. Non-tender.  Extremities: No lower extremity edema. Pulses are 2 + in the bilateral DP/PT.  Echo 02/16/12: Left ventricle: The cavity size was normal. Wall thickness was increased in a pattern of mild LVH. There was focal basal hypertrophy. Systolic function was normal. The estimated ejection fraction was in the range of 60% to 65%. - Aortic valve: There was mild stenosis. - Left atrium: The atrium was moderately dilated. - Right atrium: The atrium was mildly dilated.  EKG:  EKG is ordered today. The ekg ordered today demonstrates Atrial fibrillation, rate 81 bpm. LVH. Old inferior infarct. TWI anterolateral leads.   Recent Labs: No results found for requested labs within last 365 days.   Lipid Panel No results found for: CHOL, TRIG, HDL, CHOLHDL, VLDL, LDLCALC, LDLDIRECT   Wt Readings from Last 3 Encounters:  05/24/14 212 lb 6.4 oz (96.344 kg)  11/21/13 219 lb (99.338 kg)  05/23/13 221 lb (100.245 kg)     Other studies Reviewed: Additional studies/ records that were reviewed today include: . Review of the above records demonstrates:    Assessment and Plan:   1. CAD: Stable. He is doing well. He is on good medications.     2. Atrial fibrillation: Rate controlled. Continue beta blocker. Continue Xarelto for anticoagulation. Echo February 2014 with normal LV size and function. Biatrial enlargement. No valvular disease.   3. Chronic diastolic CHF: No lower ext edema. Continue Lasix daily. He will use extra as needed. No dyspnea.   4. HTN: BP controlled. No changes today.   Current medicines are reviewed at length with the patient today.  The patient does not have concerns regarding medicines.  The following  changes have been made:  no change  Labs/ tests ordered today include:  No orders of the defined types were placed in this encounter.    Disposition:   FU with me in 6 months  Signed, Lauree Chandler, MD 05/24/2014 2:24 PM    [address], [place], [place]  [address] Phone: [phone]; Fax: [phone]

## 2014-05-24 NOTE — Patient Instructions (Signed)
Medication Instructions:  Your physician recommends that you continue on your current medications as directed. Please refer to the Current Medication list given to you today.   Labwork: none  Testing/Procedures: none  Follow-Up: Your physician wants you to follow-up in: 6 months.  You will receive a reminder letter in the mail two months in advance. If you don't receive a letter, please call our office to schedule the follow-up appointment.       

## 2014-06-27 DIAGNOSIS — C61 Malignant neoplasm of prostate: Secondary | ICD-10-CM | POA: Diagnosis not present

## 2014-06-27 DIAGNOSIS — R351 Nocturia: Secondary | ICD-10-CM | POA: Diagnosis not present

## 2014-07-10 DIAGNOSIS — J441 Chronic obstructive pulmonary disease with (acute) exacerbation: Secondary | ICD-10-CM | POA: Diagnosis not present

## 2014-07-10 DIAGNOSIS — Z6830 Body mass index (BMI) 30.0-30.9, adult: Secondary | ICD-10-CM | POA: Diagnosis not present

## 2014-09-11 DIAGNOSIS — Z1389 Encounter for screening for other disorder: Secondary | ICD-10-CM | POA: Diagnosis not present

## 2014-09-11 DIAGNOSIS — Z6830 Body mass index (BMI) 30.0-30.9, adult: Secondary | ICD-10-CM | POA: Diagnosis not present

## 2014-09-11 DIAGNOSIS — J449 Chronic obstructive pulmonary disease, unspecified: Secondary | ICD-10-CM | POA: Diagnosis not present

## 2014-09-11 DIAGNOSIS — Z9181 History of falling: Secondary | ICD-10-CM | POA: Diagnosis not present

## 2014-09-11 DIAGNOSIS — I480 Paroxysmal atrial fibrillation: Secondary | ICD-10-CM | POA: Diagnosis not present

## 2014-09-11 DIAGNOSIS — N189 Chronic kidney disease, unspecified: Secondary | ICD-10-CM | POA: Diagnosis not present

## 2014-09-11 DIAGNOSIS — I10 Essential (primary) hypertension: Secondary | ICD-10-CM | POA: Diagnosis not present

## 2014-09-11 DIAGNOSIS — R609 Edema, unspecified: Secondary | ICD-10-CM | POA: Diagnosis not present

## 2014-11-18 DIAGNOSIS — R062 Wheezing: Secondary | ICD-10-CM | POA: Diagnosis not present

## 2014-11-18 DIAGNOSIS — J189 Pneumonia, unspecified organism: Secondary | ICD-10-CM | POA: Diagnosis not present

## 2014-12-05 DIAGNOSIS — I480 Paroxysmal atrial fibrillation: Secondary | ICD-10-CM | POA: Diagnosis not present

## 2014-12-05 DIAGNOSIS — Z6830 Body mass index (BMI) 30.0-30.9, adult: Secondary | ICD-10-CM | POA: Diagnosis not present

## 2014-12-05 DIAGNOSIS — J189 Pneumonia, unspecified organism: Secondary | ICD-10-CM | POA: Diagnosis not present

## 2014-12-05 DIAGNOSIS — J441 Chronic obstructive pulmonary disease with (acute) exacerbation: Secondary | ICD-10-CM | POA: Diagnosis not present

## 2014-12-14 NOTE — Progress Notes (Signed)
Chief Complaint  Patient presents with  . Follow-up    cad/chf  pt c/o sob and no energy after having pneumonia    History of Present Illness: 81 yo WM with history of persistent atrial fibrillation, CAD s/p 5V CABG 2000, HLD, HTN, DJD here today for cardiac follow up. He had bypass surgery in 2000. He had a catheterization done in 2007 which showed patent vein grafts after having an abnormal Myoview scan. His last echocardiogram was in June of 2008 at which time his ejection fraction was 55-60% and he had mild aortic stenosis. At his visit in December 2013 he was found to be in atrial fibrillation. He was started on Xarelto 15 mg po Qaily to reduce risk of CVA. He has recently been treated for pneumonia.   He is here today for follow up. He is recovering from a recent pneumonia. He has had no recent chest pain or palpitations. He has had no issues with bleeding. He feels well. No near syncope or syncope. Breathing is better over last week as he recovers from the pneumonia.   Primary Care Physician: Dr. Delena Bali ([place]) [facility])  Last Lipid Profile: Followed in primary care.   Past Medical History  Diagnosis Date  . Coronary artery disease   . Hyperlipidemia   . Hypertension   . Degenerative joint disease   . Atrial fibrillation [facility])     Past Surgical History  Procedure Laterality Date  . Coronary artery bypass graft  2000    with patent grafts in 2004.  Good left ventricular function  . Broken right leg    . Prostatectomy    . Hernia repair      multiple    Current Outpatient Prescriptions  Medication Sig Dispense Refill  . acetaminophen (TYLENOL) 325 MG tablet Take 650 mg by mouth every 6 (six) hours as needed (for pain).     Marland Kitchen alendronate (FOSAMAX) 70 MG tablet Take 70 mg by mouth every 7 (seven) days. Take with a full glass of water on an empty stomach.    . Cholecalciferol (VITAMIN D-3) 1000 UNITS CAPS Take 1,000 Units by mouth daily.     . cyanocobalamin 1000 MCG  tablet Take 1,000 mcg by mouth daily.     . furosemide (LASIX) 20 MG tablet Take 20 mg by mouth as needed (for swelling).     . Garlic Oil 123XX123 MG CAPS Take 1 capsule by mouth daily.     . Ipratropium-Albuterol (COMBIVENT RESPIMAT) 20-100 MCG/ACT AERS respimat Inhale 1 puff into the lungs every 6 (six) hours as needed (for wheezing).     . meclizine (ANTIVERT) 25 MG tablet Take 25 mg by mouth daily as needed for dizziness.    . metoprolol tartrate (LOPRESSOR) 25 MG tablet Take 12.5 mg by mouth 2 (two) times daily. Take 1/2 tab twice a day    . omeprazole (PRILOSEC) 20 MG capsule Take 20 mg by mouth daily.      . potassium chloride SA (K-DUR,KLOR-CON) 20 MEQ tablet Take 20 mEq by mouth daily.    . Rivaroxaban (XARELTO) 15 MG TABS tablet TAKE 1 TABLET BY MOUTH EVERY DAY 30 tablet 11  . simvastatin (ZOCOR) 80 MG tablet Take 40 mg by mouth at bedtime.      . tamsulosin (FLOMAX) 0.4 MG CAPS capsule Take 0.2 mg by mouth daily after supper.     . vitamin E 400 UNIT capsule Take 400 Units by mouth daily.  No current facility-administered medications for this visit.    Allergies  Allergen Reactions  . Codeine Other (See Comments)    Not known     Social History   Social History  . Marital Status: Married    Spouse Name: N/A  . Number of Children: 4  . Years of Education: N/A   Occupational History  . 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 Smoking status: Former Smoker    Quit date: 09/13/1949  . Smokeless tobacco: Not on file  . Alcohol Use: No  . Drug Use: No  . Sexual Activity: Not on file   Other Topics Concern  . Not on file   Social History Narrative    Family History  Problem Relation Age of Onset  . Heart failure Mother     Age 78  . Stroke Father     Age 12    Review of Systems:  As stated in the HPI and otherwise negative.   BP 118/70 mmHg  Pulse 66  Ht 5\' 6"  (1.676 m)  Wt 200 lb (90.719 kg)  BMI 32.30 kg/m2  SpO2  97%  Physical Examination: General: Well developed, well nourished, NAD HEENT: OP clear, mucus membranes moist SKIN: warm, dry. No rashes. Neuro: No focal deficits Musculoskeletal: Muscle strength 5/5 all ext Psychiatric: Mood and affect normal Neck: No JVD, no carotid bruits, no thyromegaly, no lymphadenopathy. Lungs:Clear bilaterally, no wheezes, rhonci, crackles Cardiovascular: Irregular irregular No murmurs, gallops or rubs. Abdomen:Soft. Bowel sounds present. Non-tender.  Extremities: No lower extremity edema. Pulses are 2 + in the bilateral DP/PT.  Echo 02/16/12: Left ventricle: The cavity size was normal. Wall thickness was increased in a pattern of mild LVH. There was focal basal hypertrophy. Systolic function was normal. The estimated ejection fraction was in the range of 60% to 65%. - Aortic valve: There was mild stenosis. - Left atrium: The atrium was moderately dilated. - Right atrium: The atrium was mildly dilated.  EKG:  EKG is ordered today. The ekg ordered today demonstrates Atrial fibrillation, rate 81 bpm. LVH. Old inferior infarct. TWI anterolateral leads.   Recent Labs: No results found for requested labs within last 365 days.   Lipid Panel No results found for: CHOL, TRIG, HDL, CHOLHDL, VLDL, LDLCALC, LDLDIRECT   Wt Readings from Last 3 Encounters:  12/15/14 200 lb (90.719 kg)  05/24/14 212 lb 6.4 oz (96.344 kg)  11/21/13 219 lb (99.338 kg)     Other studies Reviewed: Additional studies/ records that were reviewed today include: . Review of the above records demonstrates:    Assessment and Plan:   1. CAD: Stable. He is doing well. He is on good medications.     2. Atrial fibrillation: Rate controlled. Continue beta blocker. Continue Xarelto for anticoagulation. Echo February 2014 with normal LV size and function. Biatrial enlargement. No valvular disease.   3. Chronic diastolic CHF: No lower ext edema. Weight is stable, actually down. Continue  Lasix daily. He will use extra as needed.   4. HTN: BP controlled. No changes today.   Current medicines are reviewed at length with the patient today.  The patient does not have concerns regarding medicines.  The following changes have been made:  no change  Labs/ tests ordered today include:  No orders of the defined types were placed in this encounter.    Disposition:   FU with me in 6 months  Signed, Lauree Chandler, MD 12/15/2014 3:13 PM  [facility] [address], [place], [place]  [address] Phone: [phone]; Fax: [phone]

## 2014-12-15 ENCOUNTER — Encounter: Payer: Self-pay | Admitting: Cardiovascular Disease

## 2014-12-15 ENCOUNTER — Ambulatory Visit (INDEPENDENT_AMBULATORY_CARE_PROVIDER_SITE_OTHER): Payer: Commercial Managed Care - HMO | Admitting: Cardiovascular Disease

## 2014-12-15 VITALS — BP 118/70 | HR 66 | Ht 66.0 in | Wt 200.0 lb

## 2014-12-15 DIAGNOSIS — I481 Persistent atrial fibrillation: Secondary | ICD-10-CM

## 2014-12-15 DIAGNOSIS — I10 Essential (primary) hypertension: Secondary | ICD-10-CM | POA: Diagnosis not present

## 2014-12-15 DIAGNOSIS — I2510 Atherosclerotic heart disease of native coronary artery without angina pectoris: Secondary | ICD-10-CM

## 2014-12-15 DIAGNOSIS — I4819 Other persistent atrial fibrillation: Secondary | ICD-10-CM

## 2014-12-15 DIAGNOSIS — I5032 Chronic diastolic (congestive) heart failure: Secondary | ICD-10-CM | POA: Diagnosis not present

## 2014-12-15 NOTE — Patient Instructions (Signed)

## 2014-12-19 ENCOUNTER — Other Ambulatory Visit: Payer: Self-pay | Admitting: Cardiovascular Disease

## 2014-12-27 DIAGNOSIS — R972 Elevated prostate specific antigen [PSA]: Secondary | ICD-10-CM | POA: Diagnosis not present

## 2014-12-27 DIAGNOSIS — R351 Nocturia: Secondary | ICD-10-CM | POA: Diagnosis not present

## 2014-12-27 DIAGNOSIS — C61 Malignant neoplasm of prostate: Secondary | ICD-10-CM | POA: Diagnosis not present

## 2015-01-16 DIAGNOSIS — I10 Essential (primary) hypertension: Secondary | ICD-10-CM | POA: Diagnosis not present

## 2015-01-16 DIAGNOSIS — Z0000 Encounter for general adult medical examination without abnormal findings: Secondary | ICD-10-CM | POA: Diagnosis not present

## 2015-01-16 DIAGNOSIS — Z6829 Body mass index (BMI) 29.0-29.9, adult: Secondary | ICD-10-CM | POA: Diagnosis not present

## 2015-03-01 DIAGNOSIS — Z6830 Body mass index (BMI) 30.0-30.9, adult: Secondary | ICD-10-CM | POA: Diagnosis not present

## 2015-03-01 DIAGNOSIS — N390 Urinary tract infection, site not specified: Secondary | ICD-10-CM | POA: Diagnosis not present

## 2015-05-15 DIAGNOSIS — M47816 Spondylosis without myelopathy or radiculopathy, lumbar region: Secondary | ICD-10-CM | POA: Diagnosis not present

## 2015-05-15 DIAGNOSIS — Z6830 Body mass index (BMI) 30.0-30.9, adult: Secondary | ICD-10-CM | POA: Diagnosis not present

## 2015-05-15 DIAGNOSIS — I10 Essential (primary) hypertension: Secondary | ICD-10-CM | POA: Diagnosis not present

## 2015-06-19 ENCOUNTER — Ambulatory Visit (INDEPENDENT_AMBULATORY_CARE_PROVIDER_SITE_OTHER): Payer: Commercial Managed Care - HMO | Admitting: Cardiovascular Disease

## 2015-06-19 ENCOUNTER — Encounter: Payer: Self-pay | Admitting: Cardiovascular Disease

## 2015-06-19 VITALS — BP 124/74 | HR 87 | Ht 66.0 in | Wt 207.1 lb

## 2015-06-19 DIAGNOSIS — I350 Nonrheumatic aortic (valve) stenosis: Secondary | ICD-10-CM

## 2015-06-19 DIAGNOSIS — I5032 Chronic diastolic (congestive) heart failure: Secondary | ICD-10-CM

## 2015-06-19 DIAGNOSIS — I4819 Other persistent atrial fibrillation: Secondary | ICD-10-CM

## 2015-06-19 DIAGNOSIS — I481 Persistent atrial fibrillation: Secondary | ICD-10-CM

## 2015-06-19 DIAGNOSIS — I2510 Atherosclerotic heart disease of native coronary artery without angina pectoris: Secondary | ICD-10-CM | POA: Diagnosis not present

## 2015-06-19 DIAGNOSIS — I10 Essential (primary) hypertension: Secondary | ICD-10-CM

## 2015-06-19 NOTE — Progress Notes (Signed)
Chief Complaint  Patient presents with  . Shortness of Breath    History of Present Illness: 82 yo WM with history of persistent atrial fibrillation, CAD s/p 5V CABG 2000, HLD, HTN, DJD here today for cardiac follow up. He had bypass surgery in 2000. He had a catheterization done in 2007 which showed patent vein grafts after having an abnormal Myoview scan. His last echocardiogram was in February 2014 and this showed normal LV function, mild AS. At his visit in December 2013 he was found to be in atrial fibrillation. He was started on Xarelto 15 mg po Qaily to reduce risk of CVA.   He is here today for follow up. He has had no recent chest pain or palpitations. He has had no issues with bleeding. He feels well. No near syncope or syncope. Skin cancer removed from left arm at the [place]. He is questioning some redness around the area that was treated. No fevers or chills.   Primary Care Physician: Laverna Peace, NP  Last Lipid Profile: Followed in primary care.   Past Medical History  Diagnosis Date  . Coronary artery disease   . Hyperlipidemia   . Hypertension   . Degenerative joint disease   . Atrial fibrillation [facility])     Past Surgical History  Procedure Laterality Date  . Coronary artery bypass graft  2000    with patent grafts in 2004.  Good left ventricular function  . Broken right leg    . Prostatectomy    . Hernia repair      multiple    Current Outpatient Prescriptions  Medication Sig Dispense Refill  . acetaminophen (TYLENOL) 325 MG tablet Take 650 mg by mouth every 6 (six) hours as needed (for pain).     Marland Kitchen alendronate (FOSAMAX) 70 MG tablet Take 70 mg by mouth every 7 (seven) days. Take with a full glass of water on an empty stomach.    . Cholecalciferol (VITAMIN D-3) 1000 UNITS CAPS Take 1,000 Units by mouth daily.     . cyanocobalamin 1000 MCG tablet Take 1,000 mcg by mouth daily.     . furosemide (LASIX) 20 MG tablet Take 20 mg by mouth as needed (for swelling).     .  Garlic Oil 123XX123 MG CAPS Take 1 capsule by mouth daily.     . Ipratropium-Albuterol (COMBIVENT RESPIMAT) 20-100 MCG/ACT AERS respimat Inhale 1 puff into the lungs every 6 (six) hours as needed (for wheezing).     . meclizine (ANTIVERT) 25 MG tablet Take 25 mg by mouth daily as needed for dizziness.    . metoprolol tartrate (LOPRESSOR) 25 MG tablet Take 12.5 mg by mouth 2 (two) times daily. Take 1/2 tab twice a day    . omeprazole (PRILOSEC) 20 MG capsule Take 20 mg by mouth daily.      . potassium chloride SA (K-DUR,KLOR-CON) 20 MEQ tablet Take 20 mEq by mouth daily.    . simvastatin (ZOCOR) 80 MG tablet Take 40 mg by mouth at bedtime.      . tamsulosin (FLOMAX) 0.4 MG CAPS capsule Take 0.2 mg by mouth daily after supper.     . vitamin E 400 UNIT capsule Take 400 Units by mouth daily.      Alveda Reasons 15 MG TABS tablet TAKE 1 TABLET BY MOUTH EVERY DAY 30 tablet 6   No current facility-administered medications for this visit.    Allergies  Allergen Reactions  . Codeine Other (See Comments)    Not  known     Social History   Social History  . Marital Status: Married    Spouse Name: N/A  . Number of Children: 4  . Years of Education: N/A   Occupational History  . 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 Smoking status: Former Smoker    Quit date: 09/13/1949  . Smokeless tobacco: Not on file  . Alcohol Use: No  . Drug Use: No  . Sexual Activity: Not on file   Other Topics Concern  . Not on file   Social History Narrative    Family History  Problem Relation Age of Onset  . Heart failure Mother     Age 4  . Stroke Father     Age 64    Review of Systems:  As stated in the HPI and otherwise negative.   BP 124/74 mmHg  Pulse 87  Ht 5\' 6"  (1.676 m)  Wt 207 lb 1.9 oz (93.949 kg)  BMI 33.45 kg/m2  Physical Examination: General: Well developed, well nourished, NAD HEENT: OP clear, mucus membranes moist SKIN: warm, dry. No rashes. Neuro: No focal  deficits Musculoskeletal: Muscle strength 5/5 all ext Psychiatric: Mood and affect normal Neck: No JVD, no carotid bruits, no thyromegaly, no lymphadenopathy. Lungs:Clear bilaterally, no wheezes, rhonci, crackles Cardiovascular: Irregular irregular No murmurs, gallops or rubs. Abdomen:Soft. Bowel sounds present. Non-tender.  Extremities: No lower extremity edema. Pulses are 2 + in the bilateral DP/PT.  Echo 02/16/12: Left ventricle: The cavity size was normal. Wall thickness was increased in a pattern of mild LVH. There was focal basal hypertrophy. Systolic function was normal. The estimated ejection fraction was in the range of 60% to 65%. - Aortic valve: There was mild stenosis. - Left atrium: The atrium was moderately dilated. - Right atrium: The atrium was mildly dilated.  EKG:  EKG is  ordered today. The ekg ordered today demonstrates atrial fib, rate 87 bpm. PVC  Recent Labs: No results found for requested labs within last 365 days.   Lipid Panel No results found for: CHOL, TRIG, HDL, CHOLHDL, VLDL, LDLCALC, LDLDIRECT   Wt Readings from Last 3 Encounters:  06/19/15 207 lb 1.9 oz (93.949 kg)  12/15/14 200 lb (90.719 kg)  05/24/14 212 lb 6.4 oz (96.344 kg)     Other studies Reviewed: Additional studies/ records that were reviewed today include: . Review of the above records demonstrates:    Assessment and Plan:   1. CAD: He has had no chest pain worrisome for unstable angina. He is on good medications. No plans for stress testing given advanced age. No ASA since he is on Xarelto.   2. Atrial fibrillation: Rate controlled. Continue beta blocker. Continue Xarelto for anticoagulation. Echo February 2014 with normal LV size and function. Biatrial enlargement.   3. Chronic diastolic CHF: No lower ext edema. Weight is stable. Continue Lasix daily. He will use extra as needed.   4. HTN: BP controlled. No changes today.   5. Aortic stenosis: Mild by echo 2014.   6. Right  arm wound: Post treatment at the [facility] in [facility]. I have asked him to call the [facility] if this area changes in appearance.   Current medicines are reviewed at length with the patient today.  The patient does not have concerns regarding medicines.  The following changes have been made:  no change  Labs/ tests ordered today include:  No orders of the defined types were placed in this encounter.  Disposition:   FU with me in 6 months  Signed, Lauree Chandler, MD 06/19/2015 2:10 PM    [facility] [address], [place], [place]  [address] Phone: [phone]; Fax: [phone]

## 2015-06-19 NOTE — Patient Instructions (Signed)

## 2015-06-27 DIAGNOSIS — R351 Nocturia: Secondary | ICD-10-CM | POA: Diagnosis not present

## 2015-06-27 DIAGNOSIS — C61 Malignant neoplasm of prostate: Secondary | ICD-10-CM | POA: Diagnosis not present

## 2015-07-19 ENCOUNTER — Other Ambulatory Visit: Payer: Self-pay | Admitting: Cardiovascular Disease

## 2015-09-18 DIAGNOSIS — J449 Chronic obstructive pulmonary disease, unspecified: Secondary | ICD-10-CM | POA: Diagnosis not present

## 2015-09-18 DIAGNOSIS — Z9181 History of falling: Secondary | ICD-10-CM | POA: Diagnosis not present

## 2015-09-18 DIAGNOSIS — R609 Edema, unspecified: Secondary | ICD-10-CM | POA: Diagnosis not present

## 2015-09-18 DIAGNOSIS — I480 Paroxysmal atrial fibrillation: Secondary | ICD-10-CM | POA: Diagnosis not present

## 2015-09-18 DIAGNOSIS — Z79899 Other long term (current) drug therapy: Secondary | ICD-10-CM | POA: Diagnosis not present

## 2015-09-18 DIAGNOSIS — I10 Essential (primary) hypertension: Secondary | ICD-10-CM | POA: Diagnosis not present

## 2015-09-18 DIAGNOSIS — Z1389 Encounter for screening for other disorder: Secondary | ICD-10-CM | POA: Diagnosis not present

## 2015-11-09 DIAGNOSIS — Z23 Encounter for immunization: Secondary | ICD-10-CM | POA: Diagnosis not present

## 2015-12-30 ENCOUNTER — Emergency Department (HOSPITAL_COMMUNITY)
Admission: EM | Admit: 2015-12-30 | Discharge: 2015-12-30 | Disposition: A | Discharge status: Home / Self Care | Payer: Commercial Managed Care - HMO | Attending: Emergency Medicine | Admitting: Emergency Medicine

## 2015-12-30 ENCOUNTER — Encounter (HOSPITAL_COMMUNITY): Payer: Self-pay | Admitting: Emergency Medicine

## 2015-12-30 ENCOUNTER — Emergency Department (HOSPITAL_COMMUNITY): Payer: Commercial Managed Care - HMO

## 2015-12-30 DIAGNOSIS — Z87891 Personal history of nicotine dependence: Secondary | ICD-10-CM | POA: Insufficient documentation

## 2015-12-30 DIAGNOSIS — Z951 Presence of aortocoronary bypass graft: Secondary | ICD-10-CM | POA: Diagnosis not present

## 2015-12-30 DIAGNOSIS — I10 Essential (primary) hypertension: Secondary | ICD-10-CM | POA: Insufficient documentation

## 2015-12-30 DIAGNOSIS — J069 Acute upper respiratory infection, unspecified: Secondary | ICD-10-CM | POA: Insufficient documentation

## 2015-12-30 DIAGNOSIS — R05 Cough: Secondary | ICD-10-CM | POA: Diagnosis not present

## 2015-12-30 DIAGNOSIS — R0789 Other chest pain: Secondary | ICD-10-CM | POA: Diagnosis not present

## 2015-12-30 DIAGNOSIS — I2510 Atherosclerotic heart disease of native coronary artery without angina pectoris: Secondary | ICD-10-CM | POA: Insufficient documentation

## 2015-12-30 LAB — I-STAT TROPONIN, ED: TROPONIN I, POC: 0.01 ng/mL (ref 0.00–0.08)

## 2015-12-30 LAB — BASIC METABOLIC PANEL
ANION GAP: 9 (ref 5–15)
BUN: 26 mg/dL — ABNORMAL HIGH (ref 6–20)
CALCIUM: 9.4 mg/dL (ref 8.9–10.3)
CO2: 26 mmol/L (ref 22–32)
Chloride: 103 mmol/L (ref 101–111)
Creatinine, Ser: 1.26 mg/dL — ABNORMAL HIGH (ref 0.61–1.24)
GFR, EST AFRICAN AMERICAN: 53 mL/min — AB (ref >60)
GFR, EST NON AFRICAN AMERICAN: 46 mL/min — AB (ref >60)
GLUCOSE: 114 mg/dL — AB (ref 65–99)
Potassium: 4.4 mmol/L (ref 3.5–5.1)
SODIUM: 138 mmol/L (ref 135–145)

## 2015-12-30 LAB — CBC
HCT: 37.4 % — ABNORMAL LOW (ref 39.0–52.0)
HEMOGLOBIN: 12.4 g/dL — AB (ref 13.0–17.0)
MCH: 32.8 pg (ref 26.0–34.0)
MCHC: 33.2 g/dL (ref 30.0–36.0)
MCV: 98.9 fL (ref 78.0–100.0)
Platelets: 155 10*3/uL (ref 150–400)
RBC: 3.78 MIL/uL — AB (ref 4.22–5.81)
RDW: 13.2 % (ref 11.5–15.5)
WBC: 6.8 10*3/uL (ref 4.0–10.5)

## 2015-12-30 MED ORDER — DM-GUAIFENESIN ER 30-600 MG PO TB12: 1.0000 | ORAL_TABLET | Freq: Two times a day (BID) | ORAL | 0 refills | Status: AC

## 2015-12-30 MED ORDER — AZITHROMYCIN 250 MG PO TABS: 250.0000 mg | ORAL_TABLET | Freq: Every day | ORAL | 0 refills | Status: DC

## 2015-12-30 NOTE — ED Provider Notes (Signed)
Emergency Department Provider Note   I have reviewed the triage vital signs and the nursing notes.   HISTORY  Chief Complaint Dizziness   HPI George [name] is a 82 y.o. male with past medical history significant for persistent A. Fib. On Xeralto, CAD S/P 5 vessel CABG in 2000, hypertension and hyperlipidemia came to ED with worsening cough and shortness of breath. Patient states that he is having this nagging dry cough since Friday and worsening shortness of breath. He denies any fever or chills. No history of nasal congestion or sore throat. No history of any sick contact. He drove himself to an urgent care and [place] today, they did a chest x-ray and told him that he had a fluid around his heart and ask him to go to ED.   Past Medical History:  Diagnosis Date  . Atrial fibrillation ([place])   . Coronary artery disease   . Degenerative joint disease   . Hyperlipidemia   . Hypertension     Patient Active Problem List   Diagnosis Date Noted  . Atrial fibrillation ([place]) 12/18/2011  . HYPERLIPIDEMIA-MIXED 12/11/2008  . HYPERTENSION, BENIGN 12/11/2008  . CAD, AUTOLOGOUS BYPASS GRAFT 12/11/2008    Past Surgical History:  Procedure Laterality Date  . Broken right leg    . CORONARY ARTERY BYPASS GRAFT  2000   with patent grafts in 2004.  Good left ventricular function  . HERNIA REPAIR     multiple  . PROSTATECTOMY      Current Outpatient Rx  . Order #: [id number] Class: Historical Med  . Order #: [id number] Class: Historical Med  . Order #: [id number] Class: Historical Med  . Order #: [id number] Class: Historical Med  . Order #: [id number] Class: Historical Med  . Order #: [id number] Class: Historical Med  . Order #: [id number] Class: Historical Med  . Order #: [id number] Class: Historical Med  . Order #: [id number] Class: Historical Med  . Order #: [id number] Class: Historical Med  . Order #: [id number] Class: Historical Med  . Order #: [id number] Class: Historical Med  . Order #: [id number] Class:  Historical Med  . Order #: [id number] Class: Historical Med  . Order #: [id number] Class: Normal    Allergies Codeine  Family History  Problem Relation Age of Onset  . Heart failure Mother     Age 17  . Stroke Father     Age 82    Social History Social History  Substance Use Topics  . Smoking status: Former Smoker    Quit date: 09/13/1949  . Smokeless tobacco: Not on file  . Alcohol use No    Review of Systems Constitutional: No fever/chills Eyes: No visual changes. ENT: No sore throat. Cardiovascular: Denies chest pain. Respiratory: shortness of breath. Dry cough. Gastrointestinal: No abdominal pain.  No nausea, no vomiting.  No diarrhea.  No constipation. Genitourinary: Negative for dysuria. Musculoskeletal: Negative for back pain. Skin: Negative for rash. Neurological: Negative for headaches, focal weakness or numbness. 10-point ROS otherwise negative.  ____________________________________________   PHYSICAL EXAM:  VITAL SIGNS: ED Triage Vitals [12/30/15 1403]  Enc Vitals Group     BP 109/74     Pulse Rate 86     Resp 18     Temp 98.2 F (36.8 C)     Temp Source Oral     SpO2 96 %     Weight      Height      Head Circumference      Peak Flow      Pain Score  Pain Loc      Pain Edu?      Excl. in [employer]?    Constitutional: Alert and oriented. Well appearing and in no acute distress. Eyes: Conjunctivae are normal. PERRL. EOMI. Head: Atraumatic. Nose: No congestion/rhinnorhea. Mouth/Throat: Mucous membranes are moist.  Oropharynx non-erythematous. Neck: No stridor.  No meningeal signs. No JVD Cardiovascular: Irregular,  Grossly normal heart sounds.   Respiratory: Normal respiratory effort.  No retractions. Coarse breath sounds and few scattered wheeze bilaterally. Gastrointestinal: Soft and nontender. No distention.  Musculoskeletal: 1+ pedal edema bilaterally, more on right with signs of venous congestion in both lower extremities. Bilateral scattered  ecchymosis in upper extremities.  Neurologic:  Normal speech and language. No gross focal neurologic deficits are appreciated.   ____________________________________________   LABS (all labs ordered are listed, but only abnormal results are displayed)  Labs Reviewed  BASIC METABOLIC PANEL - Abnormal; Notable for the following:       Result Value   Glucose, Bld 114 (*)    BUN 26 (*)    Creatinine, Ser 1.26 (*)    GFR calc non Af Amer 46 (*)    GFR calc Af Amer 53 (*)    All other components within normal limits  CBC - Abnormal; Notable for the following:    RBC 3.78 (*)    Hemoglobin 12.4 (*)    HCT 37.4 (*)    All other components within normal limits  I-STAT TROPOININ, ED   ____________________________________________  EKG  Shows A. Fib. No acute change.  ____________________________________________  RADIOLOGY  Dg Chest 2 View  Result Date: 12/30/2015 CLINICAL DATA:  Dry cough. EXAM: CHEST  2 VIEW COMPARISON:  None. FINDINGS: Elevation of the right hemidiaphragm is identified. Mild opacity in the bases is favored to represent atelectasis rather than early infiltrate. No other interval changes or acute abnormalities. IMPRESSION: Mild opacity in the lung bases is favored to represent atelectasis rather than infiltrate. No acute abnormalities identified. Electronically Signed   By: Dorise Bullion III M.D   On: 12/30/2015 15:25    ____________________________________________   PROCEDURES  Procedure(s) performed:   Procedures   ____________________________________________   INITIAL IMPRESSION / ASSESSMENT AND PLAN / ED COURSE  Pertinent labs & imaging results that were available during my care of the patient were reviewed by me and considered in my medical decision making (see chart for details).  George [name] is a 82 y.o. male with past medical history significant for persistent A. Fib. On Xeralto, CAD S/P 5 vessel CABG in 2000, hypertension and hyperlipidemia  came to ED with worsening cough and shortness of breath. Patient states that he is having this nagging dry cough since Friday and worsening shortness of breath. He denies any fever or chills. No history of nasal congestion or sore throat. No history of any sick contact. He drove himself to an urgent care and [place] today, they did a chest x-ray and told him that he had a fluid around his heart and ask him to go to ED.  His chest x-ray was positive for mild atelectasis. He is afebrile and there was no leukocytosis.  Most probably he is having some upper respiratory infection.  He can be discharged home on a course of Z-Pak and Mucinex. He was advised to use his nebulizer twice a day for next few days.   ____________________________________________  FINAL CLINICAL IMPRESSION(S) / ED DIAGNOSES  Upper respiratory infection.   MEDICATIONS GIVEN DURING THIS VISIT:  Medications - No  data to display   NEW OUTPATIENT MEDICATIONS STARTED DURING THIS VISIT:  New Prescriptions   No medications on file      Note:  This document was prepared using Dragon voice recognition software and may include unintentional dictation errors.  Emergency Medicine   Lorella Nimrod, MD 12/30/15 [place], MD 12/30/15 1640

## 2015-12-30 NOTE — ED Triage Notes (Signed)
Pt here from [facility] for eval of dizziness yesterday and found increased fluid on chest xray today; pt denies pain or SOB; pt denies other sx at present

## 2015-12-30 NOTE — Discharge Instructions (Signed)
Thank you for visiting ED today. Please use your nebulizer twice daily for next few days. Use your medicines as directed. Please seek medical assistance if your shortness of breath got worse or you develop new symptoms.

## 2016-01-03 DIAGNOSIS — E663 Overweight: Secondary | ICD-10-CM | POA: Diagnosis not present

## 2016-01-03 DIAGNOSIS — J441 Chronic obstructive pulmonary disease with (acute) exacerbation: Secondary | ICD-10-CM | POA: Diagnosis not present

## 2016-01-03 DIAGNOSIS — Z6829 Body mass index (BMI) 29.0-29.9, adult: Secondary | ICD-10-CM | POA: Diagnosis not present

## 2016-01-16 DIAGNOSIS — J441 Chronic obstructive pulmonary disease with (acute) exacerbation: Secondary | ICD-10-CM | POA: Diagnosis not present

## 2016-01-16 DIAGNOSIS — J180 Bronchopneumonia, unspecified organism: Secondary | ICD-10-CM | POA: Diagnosis not present

## 2016-01-21 DIAGNOSIS — J180 Bronchopneumonia, unspecified organism: Secondary | ICD-10-CM | POA: Diagnosis not present

## 2016-01-21 DIAGNOSIS — I480 Paroxysmal atrial fibrillation: Secondary | ICD-10-CM | POA: Diagnosis not present

## 2016-01-21 DIAGNOSIS — M47816 Spondylosis without myelopathy or radiculopathy, lumbar region: Secondary | ICD-10-CM | POA: Diagnosis not present

## 2016-01-21 DIAGNOSIS — I10 Essential (primary) hypertension: Secondary | ICD-10-CM | POA: Diagnosis not present

## 2016-01-21 DIAGNOSIS — J441 Chronic obstructive pulmonary disease with (acute) exacerbation: Secondary | ICD-10-CM | POA: Diagnosis not present

## 2016-01-29 DIAGNOSIS — C61 Malignant neoplasm of prostate: Secondary | ICD-10-CM | POA: Diagnosis not present

## 2016-01-29 DIAGNOSIS — R351 Nocturia: Secondary | ICD-10-CM | POA: Diagnosis not present

## 2016-02-13 ENCOUNTER — Encounter: Payer: Self-pay | Admitting: Cardiovascular Disease

## 2016-02-13 ENCOUNTER — Ambulatory Visit (INDEPENDENT_AMBULATORY_CARE_PROVIDER_SITE_OTHER): Payer: Medicare HMO | Admitting: Cardiovascular Disease

## 2016-02-13 ENCOUNTER — Telehealth: Payer: Self-pay | Admitting: *Deleted

## 2016-02-13 VITALS — BP 132/80 | HR 80 | Ht 66.0 in | Wt 204.8 lb

## 2016-02-13 DIAGNOSIS — I481 Persistent atrial fibrillation: Secondary | ICD-10-CM

## 2016-02-13 DIAGNOSIS — I350 Nonrheumatic aortic (valve) stenosis: Secondary | ICD-10-CM

## 2016-02-13 DIAGNOSIS — I5032 Chronic diastolic (congestive) heart failure: Secondary | ICD-10-CM | POA: Diagnosis not present

## 2016-02-13 DIAGNOSIS — I2510 Atherosclerotic heart disease of native coronary artery without angina pectoris: Secondary | ICD-10-CM | POA: Diagnosis not present

## 2016-02-13 DIAGNOSIS — I4819 Other persistent atrial fibrillation: Secondary | ICD-10-CM

## 2016-02-13 DIAGNOSIS — I10 Essential (primary) hypertension: Secondary | ICD-10-CM

## 2016-02-13 NOTE — Telephone Encounter (Signed)
Four bottles of xarelto 15 mg and patient assistance paperwork was given to patient at office visit. Patient aware to complete paperwork and return it to the office.

## 2016-02-13 NOTE — Patient Instructions (Signed)

## 2016-02-13 NOTE — Progress Notes (Signed)
Chief Complaint  Patient presents with  . Coronary Artery Disease    History of Present Illness: 83 yo WM with history of persistent atrial fibrillation, CAD s/p 5V CABG 2000, HLD, HTN, DJD here today for cardiac follow up. He had bypass surgery in 2000. He had a catheterization done in 2007 which showed patent vein grafts after having an abnormal Myoview scan. His last echocardiogram was in February 2014 and this showed normal LV function, mild AS. At his visit in December 2013 he was found to be in atrial fibrillation. He was started on Xarelto 15 mg po Qaily to reduce risk of CVA.   He is here today for follow up. He has had no recent chest pain or palpitations. He has had no issues with bleeding. He feels well. No near syncope or syncope. Recent URI but he is over this. Taking lasix every day.   Primary Care Physician: Laverna Peace, NP   Past Medical History:  Diagnosis Date  . Atrial fibrillation ([place])   . Coronary artery disease   . Degenerative joint disease   . Hyperlipidemia   . Hypertension     Past Surgical History:  Procedure Laterality Date  . Broken right leg    . CORONARY ARTERY BYPASS GRAFT  2000   with patent grafts in 2004.  Good left ventricular function  . HERNIA REPAIR     multiple  . PROSTATECTOMY      Current Outpatient Prescriptions  Medication Sig Dispense Refill  . acetaminophen (TYLENOL) 325 MG tablet Take 650 mg by mouth every 6 (six) hours as needed (for pain).     Marland Kitchen alendronate (FOSAMAX) 70 MG tablet Take 70 mg by mouth every 7 (seven) days. Take with a full glass of water on an empty stomach.    . Ascorbic Acid (VITAMIN C) 1000 MG tablet Take 1,000 mg by mouth daily.    . calcium carbonate (OSCAL) 1500 (600 Ca) MG TABS tablet Take 600 mg of elemental calcium by mouth 2 (two) times daily with a meal.    . Cholecalciferol (VITAMIN D-3) 1000 UNITS CAPS Take 1,000 Units by mouth daily.     . cyanocobalamin 1000 MCG tablet Take 1,000 mcg by mouth daily.      Marland Kitchen dextromethorphan-guaiFENesin (MUCINEX DM) 30-600 MG 12hr tablet Take 1 tablet by mouth 2 (two) times daily. 15 tablet 0  . ferrous sulfate 325 (65 FE) MG tablet Take 325 mg by mouth daily with breakfast.    . finasteride (PROSCAR) 5 MG tablet Take 5 mg by mouth daily.    . furosemide (LASIX) 20 MG tablet Take 20 mg by mouth as needed (for swelling).     . Garlic Oil 123XX123 MG CAPS Take 1 capsule by mouth daily.     . Ipratropium-Albuterol (COMBIVENT RESPIMAT) 20-100 MCG/ACT AERS respimat Inhale 1 puff into the lungs every 6 (six) hours as needed (for wheezing).     . meclizine (ANTIVERT) 25 MG tablet Take 25 mg by mouth daily as needed for dizziness.    . metoprolol tartrate (LOPRESSOR) 25 MG tablet Take 12.5 mg by mouth 2 (two) times daily. Take 1/2 tab twice a day    . omeprazole (PRILOSEC) 20 MG capsule Take 20 mg by mouth daily.      . potassium chloride SA (K-DUR,KLOR-CON) 20 MEQ tablet Take 20 mEq by mouth daily.    . simvastatin (ZOCOR) 80 MG tablet Take 40 mg by mouth at bedtime.      Marland Kitchen  tamsulosin (FLOMAX) 0.4 MG CAPS capsule Take 0.2 mg by mouth daily after supper.     . vitamin E 400 UNIT capsule Take 400 Units by mouth daily.      Alveda Reasons 15 MG TABS tablet TAKE 1 TABLET BY MOUTH EVERY DAY 30 tablet 11   No current facility-administered medications for this visit.     Allergies  Allergen Reactions  . Codeine Other (See Comments)    Not known     Social History   Social History  . Marital status: Widowed    Spouse name: N/A  . Number of children: 4  . Years of education: N/A   Occupational History  . retired-[employer] Retired   Social History Main Topics  . Smoking status: Former Smoker    Quit date: 09/13/1949  . Smokeless tobacco: Never Used  . Alcohol use No  . Drug use: No  . Sexual activity: Not on file   Other Topics Concern  . Not on file   Social History Narrative  . No narrative on file    Family History  Problem Relation Age of  Onset  . Heart failure Mother     Age 56  . Stroke Father     Age 18    Review of Systems:  As stated in the HPI and otherwise negative.   BP 132/80   Pulse 80   Ht 5\' 6"  (1.676 m)   Wt 204 lb 12.8 oz (92.9 kg)   BMI 33.06 kg/m   Physical Examination: General: Well developed, well nourished, NAD  HEENT: OP clear, mucus membranes moist  SKIN: warm, dry. No rashes. Neuro: No focal deficits  Musculoskeletal: Muscle strength 5/5 all ext  Psychiatric: Mood and affect normal  Neck: No JVD, no carotid bruits, no thyromegaly, no lymphadenopathy.  Lungs:Clear bilaterally, no wheezes, rhonci, crackles Cardiovascular: Irregular irregular No murmurs, gallops or rubs. Abdomen:Soft. Bowel sounds present. Non-tender.  Extremities: No lower extremity edema. Pulses are 2 + in the bilateral DP/PT.  Echo 02/16/12: Left ventricle: The cavity size was normal. Wall thickness was increased in a pattern of mild LVH. There was focal basal hypertrophy. Systolic function was normal. The estimated ejection fraction was in the range of 60% to 65%. - Aortic valve: There was mild stenosis. - Left atrium: The atrium was moderately dilated. - Right atrium: The atrium was mildly dilated.  EKG:  EKG is  not ordered today. The ekg ordered today demonstrates   Recent Labs: 12/30/2015: BUN 26; Creatinine, Ser 1.26; Hemoglobin 12.4; Platelets 155; Potassium 4.4; Sodium 138   Lipid Panel No results found for: CHOL, TRIG, HDL, CHOLHDL, VLDL, LDLCALC, LDLDIRECT   Wt Readings from Last 3 Encounters:  02/13/16 204 lb 12.8 oz (92.9 kg)  06/19/15 207 lb 1.9 oz (93.9 kg)  12/15/14 200 lb (90.7 kg)     Other studies Reviewed: Additional studies/ records that were reviewed today include: . Review of the above records demonstrates:    Assessment and Plan:   1. CAD s/p CABG without angina: He has had no chest pain worrisome for unstable angina. He is on good medications. No plans for stress testing given  advanced age. No ASA since he is on Xarelto.   2. Atrial fibrillation, persistent: Rate controlled. Continue beta blocker. Continue Xarelto for anticoagulation. Echo February 2014 with normal LV size and function. Biatrial enlargement.   3. Chronic diastolic CHF: No lower ext edema. Weight is stable. Continue Lasix daily. He will use extra  as needed.   4. HTN: BP controlled. No changes today.   5. Aortic stenosis: Mild by echo 2014. Will not repeat echo given age.   Current medicines are reviewed at length with the patient today.  The patient does not have concerns regarding medicines.  The following changes have been made:  no change  Labs/ tests ordered today include:  No orders of the defined types were placed in this encounter.   Disposition:   FU with me in 6 months  Signed, Lauree Chandler, MD 02/13/2016 5:00 PM    [facility], [place], [place]  [address] Phone: [phone]; Fax: [phone]

## 2016-02-22 ENCOUNTER — Telehealth: Payer: Self-pay | Admitting: Cardiovascular Disease

## 2016-02-22 NOTE — Telephone Encounter (Signed)
Patient Assistance paperwork dropped off by patient gave to [facility].

## 2016-02-26 ENCOUNTER — Telehealth: Payer: Self-pay | Admitting: *Deleted

## 2016-02-26 NOTE — Telephone Encounter (Signed)
Assistance application for Xarelto faxed to [employer] and [employer].

## 2016-03-03 DIAGNOSIS — J069 Acute upper respiratory infection, unspecified: Secondary | ICD-10-CM | POA: Diagnosis not present

## 2016-03-03 DIAGNOSIS — J309 Allergic rhinitis, unspecified: Secondary | ICD-10-CM | POA: Diagnosis not present

## 2016-03-03 DIAGNOSIS — Z6828 Body mass index (BMI) 28.0-28.9, adult: Secondary | ICD-10-CM | POA: Diagnosis not present

## 2016-05-01 DIAGNOSIS — E669 Obesity, unspecified: Secondary | ICD-10-CM | POA: Diagnosis not present

## 2016-05-01 DIAGNOSIS — Z125 Encounter for screening for malignant neoplasm of prostate: Secondary | ICD-10-CM | POA: Diagnosis not present

## 2016-05-01 DIAGNOSIS — Z9181 History of falling: Secondary | ICD-10-CM | POA: Diagnosis not present

## 2016-05-01 DIAGNOSIS — Z136 Encounter for screening for cardiovascular disorders: Secondary | ICD-10-CM | POA: Diagnosis not present

## 2016-05-01 DIAGNOSIS — Z1389 Encounter for screening for other disorder: Secondary | ICD-10-CM | POA: Diagnosis not present

## 2016-05-01 DIAGNOSIS — E785 Hyperlipidemia, unspecified: Secondary | ICD-10-CM | POA: Diagnosis not present

## 2016-05-01 DIAGNOSIS — Z0000 Encounter for general adult medical examination without abnormal findings: Secondary | ICD-10-CM | POA: Diagnosis not present

## 2016-05-28 DIAGNOSIS — I10 Essential (primary) hypertension: Secondary | ICD-10-CM | POA: Diagnosis not present

## 2016-05-28 DIAGNOSIS — E876 Hypokalemia: Secondary | ICD-10-CM | POA: Diagnosis not present

## 2016-05-28 DIAGNOSIS — J449 Chronic obstructive pulmonary disease, unspecified: Secondary | ICD-10-CM | POA: Diagnosis not present

## 2016-05-28 DIAGNOSIS — I480 Paroxysmal atrial fibrillation: Secondary | ICD-10-CM | POA: Diagnosis not present

## 2016-07-25 ENCOUNTER — Other Ambulatory Visit: Payer: Self-pay | Admitting: Cardiovascular Disease

## 2016-07-25 NOTE — Telephone Encounter (Signed)
Pt last saw Dr Angelena Form on 02/13/16, last labs 12/30/15 Creat 1.26, age 83, weight 92.9kg, CrCl 44.03, based on CrCl pt is on appropriate dosage of Xarelto 15mg  QD.  Will refill rx.

## 2016-07-28 DIAGNOSIS — R351 Nocturia: Secondary | ICD-10-CM | POA: Diagnosis not present

## 2016-07-28 DIAGNOSIS — C61 Malignant neoplasm of prostate: Secondary | ICD-10-CM | POA: Diagnosis not present

## 2016-08-27 ENCOUNTER — Encounter (INDEPENDENT_AMBULATORY_CARE_PROVIDER_SITE_OTHER): Payer: Self-pay

## 2016-08-27 ENCOUNTER — Encounter: Payer: Self-pay | Admitting: Cardiovascular Disease

## 2016-08-27 ENCOUNTER — Ambulatory Visit (INDEPENDENT_AMBULATORY_CARE_PROVIDER_SITE_OTHER): Payer: Medicare HMO | Admitting: Cardiovascular Disease

## 2016-08-27 VITALS — BP 110/64 | HR 83 | Ht 66.0 in | Wt 207.0 lb

## 2016-08-27 DIAGNOSIS — I350 Nonrheumatic aortic (valve) stenosis: Secondary | ICD-10-CM

## 2016-08-27 DIAGNOSIS — I10 Essential (primary) hypertension: Secondary | ICD-10-CM | POA: Diagnosis not present

## 2016-08-27 DIAGNOSIS — I5032 Chronic diastolic (congestive) heart failure: Secondary | ICD-10-CM | POA: Diagnosis not present

## 2016-08-27 DIAGNOSIS — I481 Persistent atrial fibrillation: Secondary | ICD-10-CM | POA: Diagnosis not present

## 2016-08-27 DIAGNOSIS — I4819 Other persistent atrial fibrillation: Secondary | ICD-10-CM

## 2016-08-27 DIAGNOSIS — I2510 Atherosclerotic heart disease of native coronary artery without angina pectoris: Secondary | ICD-10-CM

## 2016-08-27 NOTE — Progress Notes (Signed)
Chief Complaint  Patient presents with  . Follow-up    CAD    History of Present Illness: 83 yo male with history of persistent atrial fibrillation, CAD s/p 5V CABG 2000, HLD, HTN, DJD here today for cardiac follow up. He had bypass surgery in 2000. He had a catheterization done in 2007 which showed patent vein grafts after having an abnormal Myoview scan. His last echocardiogram was in February 2014 and this showed normal LV function, mild AS. At his visit in December 2013 he was found to be in atrial fibrillation. He was started on Xarelto 15 mg po Qaily.   He is here today for follow up. The patient denies any chest pain, dyspnea, palpitations, lower extremity edema, orthopnea, PND, dizziness, near syncope or syncope.   Primary Care Physician: Lowella Dandy, NP   Past Medical History:  Diagnosis Date  . Atrial fibrillation ([place])   . Coronary artery disease   . Degenerative joint disease   . Hyperlipidemia   . Hypertension     Past Surgical History:  Procedure Laterality Date  . Broken right leg    . CORONARY ARTERY BYPASS GRAFT  2000   with patent grafts in 2004.  Good left ventricular function  . HERNIA REPAIR     multiple  . PROSTATECTOMY      Current Outpatient Prescriptions  Medication Sig Dispense Refill  . acetaminophen (TYLENOL) 325 MG tablet Take 650 mg by mouth every 6 (six) hours as needed (for pain).     Marland Kitchen alendronate (FOSAMAX) 70 MG tablet Take 70 mg by mouth every 7 (seven) days. Take with a full glass of water on an empty stomach.    . Ascorbic Acid (VITAMIN C) 1000 MG tablet Take 1,000 mg by mouth daily.    . calcium carbonate (OSCAL) 1500 (600 Ca) MG TABS tablet Take 600 mg of elemental calcium by mouth 2 (two) times daily with a meal.    . Cholecalciferol (VITAMIN D-3) 1000 UNITS CAPS Take 1,000 Units by mouth daily.     . cyanocobalamin 1000 MCG tablet Take 1,000 mcg by mouth daily.     Marland Kitchen dextromethorphan-guaiFENesin (MUCINEX DM) 30-600 MG 12hr tablet  Take 1 tablet by mouth 2 (two) times daily. 15 tablet 0  . ferrous sulfate 325 (65 FE) MG tablet Take 325 mg by mouth daily with breakfast.    . finasteride (PROSCAR) 5 MG tablet Take 5 mg by mouth daily.    . furosemide (LASIX) 20 MG tablet Take 20 mg by mouth as needed (for swelling).     . Garlic Oil 6389 MG CAPS Take 1 capsule by mouth daily.     . Ipratropium-Albuterol (COMBIVENT RESPIMAT) 20-100 MCG/ACT AERS respimat Inhale 1 puff into the lungs every 6 (six) hours as needed (for wheezing).     . meclizine (ANTIVERT) 25 MG tablet Take 25 mg by mouth daily as needed for dizziness.    . metoprolol tartrate (LOPRESSOR) 25 MG tablet Take 12.5 mg by mouth 2 (two) times daily. Take 1/2 tab twice a day    . Omega-3 Fatty Acids (FISH OIL PO) Take 1 tablet by mouth daily.    Marland Kitchen omeprazole (PRILOSEC) 20 MG capsule Take 20 mg by mouth daily.      . potassium chloride SA (K-DUR,KLOR-CON) 20 MEQ tablet Take 20 mEq by mouth daily.    . simvastatin (ZOCOR) 80 MG tablet Take 40 mg by mouth at bedtime.      . tamsulosin (FLOMAX)  0.4 MG CAPS capsule Take 0.2 mg by mouth daily after supper.     . vitamin E 400 UNIT capsule Take 400 Units by mouth daily.      Alveda Reasons 15 MG TABS tablet TAKE 1 TABLET ONCE DAILY. 30 tablet 6   No current facility-administered medications for this visit.     Allergies  Allergen Reactions  . Codeine Other (See Comments)    Not known     Social History   Social History  . Marital status: Widowed    Spouse name: N/A  . Number of children: 4  . Years of education: N/A   Occupational History  . retired-[employer] Retired   Social History Main Topics  . Smoking status: Former Smoker    Quit date: 09/13/1949  . Smokeless tobacco: Never Used  . Alcohol use No  . Drug use: No  . Sexual activity: Not on file   Other Topics Concern  . Not on file   Social History Narrative  . No narrative on file    Family History  Problem Relation Age of Onset  .  Heart failure Mother        Age 80  . Stroke Father        Age 67    Review of Systems:  As stated in the HPI and otherwise negative.   BP 110/64   Pulse 83   Ht 5\' 6"  (1.676 m)   Wt 207 lb (93.9 kg)   SpO2 98%   BMI 33.41 kg/m   Physical Examination:  General: Well developed, well nourished, NAD  HEENT: OP clear, mucus membranes moist  SKIN: warm, dry. No rashes. Neuro: No focal deficits  Musculoskeletal: Muscle strength 5/5 all ext  Psychiatric: Mood and affect normal  Neck: No JVD, no carotid bruits, no thyromegaly, no lymphadenopathy.  Lungs:Clear bilaterally, no wheezes, rhonci, crackles Cardiovascular: Regular rate and rhythm. Harsh systolic murmur. No gallops or rubs. Abdomen:Soft. Bowel sounds present. Non-tender.  Extremities: No lower extremity edema. Pulses are 2 + in the bilateral DP/PT.  Echo 02/16/12: Left ventricle: The cavity size was normal. Wall thickness was increased in a pattern of mild LVH. There was focal basal hypertrophy. Systolic function was normal. The estimated ejection fraction was in the range of 60% to 65%. - Aortic valve: There was mild stenosis. - Left atrium: The atrium was moderately dilated. - Right atrium: The atrium was mildly dilated.  EKG:  EKG is not ordered today. The ekg ordered today demonstrates   Recent Labs: 12/30/2015: BUN 26; Creatinine, Ser 1.26; Hemoglobin 12.4; Platelets 155; Potassium 4.4; Sodium 138   Lipid Panel No results found for: CHOL, TRIG, HDL, CHOLHDL, VLDL, LDLCALC, LDLDIRECT   Wt Readings from Last 3 Encounters:  08/27/16 207 lb (93.9 kg)  02/13/16 204 lb 12.8 oz (92.9 kg)  06/19/15 207 lb 1.9 oz (93.9 kg)     Other studies Reviewed: Additional studies/ records that were reviewed today include: . Review of the above records demonstrates:    Assessment and Plan:   1. CAD s/p CABG without angina: He is having no chest pain suggestive of angina. He is on a beta blocker and a statin. No ASA given  use of Xarelto. No plans for ischemic testing given age.   2. Atrial fibrillation, persistent: Rate controlled on beta blocker. Will continue Xarelto. He has no bleeding issues. Echo in 2014 with normal LV function.    3. Chronic diastolic CHF: Volume status is stable.  Weight is stable. Continue Lasix 20 daily.   4. HTN: BP controlled. No changes.   5. Aortic stenosis: Echo in 2014 with mild AS. Will not repeat given age.    Current medicines are reviewed at length with the patient today.  The patient does not have concerns regarding medicines.  The following changes have been made:  no change  Labs/ tests ordered today include:  No orders of the defined types were placed in this encounter.   Disposition:   FU with me in 6 months  Signed, Lauree Chandler, MD 08/27/2016 2:10 PM    [facility] [place], [place], [place]  [address] Phone: [phone]; Fax: [phone]

## 2016-08-27 NOTE — Patient Instructions (Signed)

## 2016-08-31 DIAGNOSIS — N3001 Acute cystitis with hematuria: Secondary | ICD-10-CM | POA: Diagnosis not present

## 2016-10-02 DIAGNOSIS — I10 Essential (primary) hypertension: Secondary | ICD-10-CM | POA: Diagnosis not present

## 2016-10-02 DIAGNOSIS — E876 Hypokalemia: Secondary | ICD-10-CM | POA: Diagnosis not present

## 2016-10-02 DIAGNOSIS — I480 Paroxysmal atrial fibrillation: Secondary | ICD-10-CM | POA: Diagnosis not present

## 2016-10-02 DIAGNOSIS — Z9181 History of falling: Secondary | ICD-10-CM | POA: Diagnosis not present

## 2016-10-02 DIAGNOSIS — M47816 Spondylosis without myelopathy or radiculopathy, lumbar region: Secondary | ICD-10-CM | POA: Diagnosis not present

## 2016-10-02 DIAGNOSIS — Z1389 Encounter for screening for other disorder: Secondary | ICD-10-CM | POA: Diagnosis not present

## 2016-10-02 DIAGNOSIS — D539 Nutritional anemia, unspecified: Secondary | ICD-10-CM | POA: Diagnosis not present

## 2016-10-02 DIAGNOSIS — Z79899 Other long term (current) drug therapy: Secondary | ICD-10-CM | POA: Diagnosis not present

## 2016-10-02 DIAGNOSIS — J449 Chronic obstructive pulmonary disease, unspecified: Secondary | ICD-10-CM | POA: Diagnosis not present

## 2016-11-13 DIAGNOSIS — Z23 Encounter for immunization: Secondary | ICD-10-CM | POA: Diagnosis not present

## 2016-11-13 DIAGNOSIS — M25562 Pain in left knee: Secondary | ICD-10-CM | POA: Diagnosis not present

## 2016-11-17 DIAGNOSIS — M1712 Unilateral primary osteoarthritis, left knee: Secondary | ICD-10-CM | POA: Diagnosis not present

## 2017-01-01 DIAGNOSIS — I10 Essential (primary) hypertension: Secondary | ICD-10-CM | POA: Diagnosis not present

## 2017-01-01 DIAGNOSIS — Z79899 Other long term (current) drug therapy: Secondary | ICD-10-CM | POA: Diagnosis not present

## 2017-01-01 DIAGNOSIS — M47816 Spondylosis without myelopathy or radiculopathy, lumbar region: Secondary | ICD-10-CM | POA: Diagnosis not present

## 2017-01-01 DIAGNOSIS — J449 Chronic obstructive pulmonary disease, unspecified: Secondary | ICD-10-CM | POA: Diagnosis not present

## 2017-01-01 DIAGNOSIS — I480 Paroxysmal atrial fibrillation: Secondary | ICD-10-CM | POA: Diagnosis not present

## 2017-01-01 DIAGNOSIS — D539 Nutritional anemia, unspecified: Secondary | ICD-10-CM | POA: Diagnosis not present

## 2017-01-28 DIAGNOSIS — C61 Malignant neoplasm of prostate: Secondary | ICD-10-CM | POA: Diagnosis not present

## 2017-01-28 DIAGNOSIS — R351 Nocturia: Secondary | ICD-10-CM | POA: Diagnosis not present

## 2017-03-20 ENCOUNTER — Other Ambulatory Visit: Payer: Self-pay | Admitting: Cardiovascular Disease

## 2017-04-02 DIAGNOSIS — I10 Essential (primary) hypertension: Secondary | ICD-10-CM | POA: Diagnosis not present

## 2017-04-02 DIAGNOSIS — Z6832 Body mass index (BMI) 32.0-32.9, adult: Secondary | ICD-10-CM | POA: Diagnosis not present

## 2017-04-02 DIAGNOSIS — M47816 Spondylosis without myelopathy or radiculopathy, lumbar region: Secondary | ICD-10-CM | POA: Diagnosis not present

## 2017-04-02 DIAGNOSIS — I480 Paroxysmal atrial fibrillation: Secondary | ICD-10-CM | POA: Diagnosis not present

## 2017-04-02 DIAGNOSIS — J449 Chronic obstructive pulmonary disease, unspecified: Secondary | ICD-10-CM | POA: Diagnosis not present

## 2017-04-20 ENCOUNTER — Ambulatory Visit: Payer: Medicare HMO | Admitting: Cardiovascular Disease

## 2017-04-30 ENCOUNTER — Ambulatory Visit: Payer: Medicare HMO | Admitting: Cardiovascular Disease

## 2017-04-30 ENCOUNTER — Encounter: Payer: Self-pay | Admitting: Cardiovascular Disease

## 2017-04-30 VITALS — BP 112/70 | HR 76 | Ht 66.0 in | Wt 199.6 lb

## 2017-04-30 DIAGNOSIS — I10 Essential (primary) hypertension: Secondary | ICD-10-CM

## 2017-04-30 DIAGNOSIS — I350 Nonrheumatic aortic (valve) stenosis: Secondary | ICD-10-CM

## 2017-04-30 DIAGNOSIS — I5032 Chronic diastolic (congestive) heart failure: Secondary | ICD-10-CM | POA: Diagnosis not present

## 2017-04-30 DIAGNOSIS — I481 Persistent atrial fibrillation: Secondary | ICD-10-CM

## 2017-04-30 DIAGNOSIS — I4819 Other persistent atrial fibrillation: Secondary | ICD-10-CM

## 2017-04-30 DIAGNOSIS — I2510 Atherosclerotic heart disease of native coronary artery without angina pectoris: Secondary | ICD-10-CM

## 2017-04-30 NOTE — Patient Instructions (Signed)
Medication Instructions:  Your physician recommends that you continue on your current medications as directed. Please refer to the Current Medication list given to you today.   Labwork: Have lab work done at primary care sent to our office.   Testing/Procedures: none  Follow-Up: Your physician recommends that you schedule a follow-up appointment in: 6 months.      Any Other Special Instructions Will Be Listed Below (If Applicable).     If you need a refill on your cardiac medications before your next appointment, please call your pharmacy.

## 2017-04-30 NOTE — Progress Notes (Signed)
Chief Complaint  Patient presents with  . Coronary Artery Disease    History of Present Illness: 84 yo male with history of persistent atrial fibrillation, CAD s/p 5V CABG 2000, HLD, HTN, DJD here today for cardiac follow up. He had bypass surgery in 2000. He had a catheterization done in 2007 which showed patent vein grafts after having an abnormal Myoview scan. His last echocardiogram was in February 2014 and this showed normal LV function, mild AS. He was found to have atrial fibrillation in 2013 and was started on Xarelto. At his visit in December 2013 he was found to be in atrial fibrillation. He was started on Xarelto 15 mg po Qaily.   He is here today for follow up. The patient denies any chest pain, dyspnea, palpitations, lower extremity edema, orthopnea, PND, dizziness, near syncope or syncope.   Primary Care Physician: Lowella Dandy, NP   Past Medical History:  Diagnosis Date  . Atrial fibrillation ([place])   . Coronary artery disease   . Degenerative joint disease   . Hyperlipidemia   . Hypertension     Past Surgical History:  Procedure Laterality Date  . Broken right leg    . CORONARY ARTERY BYPASS GRAFT  2000   with patent grafts in 2004.  Good left ventricular function  . HERNIA REPAIR     multiple  . PROSTATECTOMY      Current Outpatient Medications  Medication Sig Dispense Refill  . acetaminophen (TYLENOL) 325 MG tablet Take 650 mg by mouth every 6 (six) hours as needed (for pain).     Marland Kitchen alendronate (FOSAMAX) 70 MG tablet Take 70 mg by mouth every 7 (seven) days. Take with a full glass of water on an empty stomach.    . Ascorbic Acid (VITAMIN C) 1000 MG tablet Take 1,000 mg by mouth daily.    . calcium carbonate (OSCAL) 1500 (600 Ca) MG TABS tablet Take 600 mg of elemental calcium by mouth 2 (two) times daily with a meal.    . Cholecalciferol (VITAMIN D-3) 1000 UNITS CAPS Take 1,000 Units by mouth daily.     . cyanocobalamin 1000 MCG tablet Take 1,000 mcg by mouth  daily.     Marland Kitchen dextromethorphan-guaiFENesin (MUCINEX DM) 30-600 MG 12hr tablet Take 1 tablet by mouth 2 (two) times daily. 15 tablet 0  . ferrous sulfate 325 (65 FE) MG tablet Take 325 mg by mouth daily with breakfast.    . finasteride (PROSCAR) 5 MG tablet Take 5 mg by mouth daily.    . furosemide (LASIX) 20 MG tablet Take 20 mg by mouth as needed (for swelling).     . Garlic Oil 7673 MG CAPS Take 1 capsule by mouth daily.     . Ipratropium-Albuterol (COMBIVENT RESPIMAT) 20-100 MCG/ACT AERS respimat Inhale 1 puff into the lungs every 6 (six) hours as needed (for wheezing).     . meclizine (ANTIVERT) 25 MG tablet Take 25 mg by mouth daily as needed for dizziness.    . metoprolol tartrate (LOPRESSOR) 25 MG tablet Take 12.5 mg by mouth 2 (two) times daily. Take 1/2 tab twice a day    . Omega-3 Fatty Acids (FISH OIL PO) Take 1 tablet by mouth daily.    Marland Kitchen omeprazole (PRILOSEC) 20 MG capsule Take 20 mg by mouth daily.      . potassium chloride SA (K-DUR,KLOR-CON) 20 MEQ tablet Take 20 mEq by mouth daily.    . simvastatin (ZOCOR) 80 MG tablet Take 40 mg  by mouth at bedtime.      . tamsulosin (FLOMAX) 0.4 MG CAPS capsule Take 0.2 mg by mouth daily after supper.     . vitamin E 400 UNIT capsule Take 400 Units by mouth daily.      Alveda Reasons 15 MG TABS tablet TAKE 1 TABLET ONCE DAILY. 30 tablet 5   No current facility-administered medications for this visit.     Allergies  Allergen Reactions  . Codeine Other (See Comments)    Not known     Social History   Socioeconomic History  . Marital status: Widowed    Spouse name: Not on file  . Number of children: 4  . Years of education: Not on file  . Highest education level: Not on file  Occupational History  . Occupation: Regulatory affairs officer: RETIRED  Social Needs  . Financial resource strain: Not on file  . Food insecurity:    Worry: Not on file    Inability: Not on file  . Transportation needs:    Medical: Not on file      Non-medical: Not on file  Tobacco Use  . Smoking status: Former Smoker    Last attempt to quit: 09/13/1949    Years since quitting: 67.6  . Smokeless tobacco: Never Used  Substance and Sexual Activity  . Alcohol use: No  . Drug use: No  . Sexual activity: Not on file  Lifestyle  . Physical activity:    Days per week: Not on file    Minutes per session: Not on file  . Stress: Not on file  Relationships  . Social connections:    Talks on phone: Not on file    Gets together: Not on file    Attends religious service: Not on file    Active member of club or organization: Not on file    Attends meetings of clubs or organizations: Not on file    Relationship status: Not on file  . Intimate partner violence:    Fear of current or ex partner: Not on file    Emotionally abused: Not on file    Physically abused: Not on file    Forced sexual activity: Not on file  Other Topics Concern  . Not on file  Social History Narrative  . Not on file    Family History  Problem Relation Age of Onset  . Heart failure Mother        Age 62  . Stroke Father        Age 26    Review of Systems:  As stated in the HPI and otherwise negative.   BP 112/70   Pulse 76   Ht 5\' 6"  (1.676 m)   Wt 199 lb 9.6 oz (90.5 kg)   SpO2 98%   BMI 32.22 kg/m   Physical Examination:  General: Well developed, well nourished, NAD  HEENT: OP clear, mucus membranes moist  SKIN: warm, dry. No rashes. Neuro: No focal deficits  Musculoskeletal: Muscle strength 5/5 all ext  Psychiatric: Mood and affect normal  Neck: No JVD, no carotid bruits, no thyromegaly, no lymphadenopathy.  Lungs:Clear bilaterally, no wheezes, rhonci, crackles Cardiovascular: Regular rate and rhythm. No murmurs, gallops or rubs. Abdomen:Soft. Bowel sounds present. Non-tender.  Extremities: No lower extremity edema. Pulses are 2 + in the bilateral DP/PT.  Echo 02/16/12: Left ventricle: The cavity size was normal. Wall thickness was  increased in a pattern of mild LVH. There was focal basal  hypertrophy. Systolic function was normal. The estimated ejection fraction was in the range of 60% to 65%. - Aortic valve: There was mild stenosis. - Left atrium: The atrium was moderately dilated. - Right atrium: The atrium was mildly dilated.  EKG:  EKG is  ordered today. The ekg ordered today demonstrates Atrial fib, rate 76 bpm. PVC.   Recent Labs: No results found for requested labs within last 8760 hours.   Lipid Panel No results found for: CHOL, TRIG, HDL, CHOLHDL, VLDL, LDLCALC, LDLDIRECT   Wt Readings from Last 3 Encounters:  04/30/17 199 lb 9.6 oz (90.5 kg)  08/27/16 207 lb (93.9 kg)  02/13/16 204 lb 12.8 oz (92.9 kg)     Other studies Reviewed: Additional studies/ records that were reviewed today include: . Review of the above records demonstrates:    Assessment and Plan:   1. CAD s/p CABG without angina: No chest pain. Will continue statin and beta blocker. No ASA given advanced age and use of Xarelto.    2. Atrial fibrillation, persistent: Rate is well controlled on the beta blocker. No bleeding issues. Continue Xarelto. No recent labs. He wishes to have labs in primary care next week. I will send a request with him for BMET, CBC, LFTS, lipids and have him ask Laverna Peace, NP to fax to [place].   3. Chronic diastolic CHF: Weight is stable. No volume overload. Continue Lasix.    4. HTN: BP is well controlled. No changes today.   5. Aortic stenosis: Echo in 2014 with mild AS. He still has a soft systolic murmur. Will not repeat given age.    Current medicines are reviewed at length with the patient today.  The patient does not have concerns regarding medicines.  The following changes have been made:  no change  Labs/ tests ordered today include:  No orders of the defined types were placed in this encounter.   Disposition:   FU with me in 6 months  Signed, Lauree Chandler, MD 04/30/2017 3:26 PM      [address], [place], [place]  [address] Phone: [phone]; Fax: [phone]

## 2017-05-12 DIAGNOSIS — Z1331 Encounter for screening for depression: Secondary | ICD-10-CM | POA: Diagnosis not present

## 2017-05-12 DIAGNOSIS — E669 Obesity, unspecified: Secondary | ICD-10-CM | POA: Diagnosis not present

## 2017-05-12 DIAGNOSIS — Z79899 Other long term (current) drug therapy: Secondary | ICD-10-CM | POA: Diagnosis not present

## 2017-05-12 DIAGNOSIS — Z125 Encounter for screening for malignant neoplasm of prostate: Secondary | ICD-10-CM | POA: Diagnosis not present

## 2017-05-12 DIAGNOSIS — Z6832 Body mass index (BMI) 32.0-32.9, adult: Secondary | ICD-10-CM | POA: Diagnosis not present

## 2017-05-12 DIAGNOSIS — Z9181 History of falling: Secondary | ICD-10-CM | POA: Diagnosis not present

## 2017-05-12 DIAGNOSIS — Z0000 Encounter for general adult medical examination without abnormal findings: Secondary | ICD-10-CM | POA: Diagnosis not present

## 2017-07-06 DIAGNOSIS — I10 Essential (primary) hypertension: Secondary | ICD-10-CM | POA: Diagnosis not present

## 2017-07-06 DIAGNOSIS — I480 Paroxysmal atrial fibrillation: Secondary | ICD-10-CM | POA: Diagnosis not present

## 2017-07-06 DIAGNOSIS — J449 Chronic obstructive pulmonary disease, unspecified: Secondary | ICD-10-CM | POA: Diagnosis not present

## 2017-07-24 DIAGNOSIS — Z6832 Body mass index (BMI) 32.0-32.9, adult: Secondary | ICD-10-CM | POA: Diagnosis not present

## 2017-07-24 DIAGNOSIS — L03115 Cellulitis of right lower limb: Secondary | ICD-10-CM | POA: Diagnosis not present

## 2017-07-28 DIAGNOSIS — R351 Nocturia: Secondary | ICD-10-CM | POA: Diagnosis not present

## 2017-07-28 DIAGNOSIS — C61 Malignant neoplasm of prostate: Secondary | ICD-10-CM | POA: Diagnosis not present

## 2017-08-12 DIAGNOSIS — L03115 Cellulitis of right lower limb: Secondary | ICD-10-CM | POA: Diagnosis not present

## 2017-10-05 DIAGNOSIS — I480 Paroxysmal atrial fibrillation: Secondary | ICD-10-CM | POA: Diagnosis not present

## 2017-10-05 DIAGNOSIS — E876 Hypokalemia: Secondary | ICD-10-CM | POA: Diagnosis not present

## 2017-10-05 DIAGNOSIS — I10 Essential (primary) hypertension: Secondary | ICD-10-CM | POA: Diagnosis not present

## 2017-10-05 DIAGNOSIS — Z6831 Body mass index (BMI) 31.0-31.9, adult: Secondary | ICD-10-CM | POA: Diagnosis not present

## 2017-10-05 DIAGNOSIS — M47816 Spondylosis without myelopathy or radiculopathy, lumbar region: Secondary | ICD-10-CM | POA: Diagnosis not present

## 2017-10-05 DIAGNOSIS — J449 Chronic obstructive pulmonary disease, unspecified: Secondary | ICD-10-CM | POA: Diagnosis not present

## 2017-10-08 DIAGNOSIS — N3000 Acute cystitis without hematuria: Secondary | ICD-10-CM | POA: Diagnosis not present

## 2017-10-08 DIAGNOSIS — R300 Dysuria: Secondary | ICD-10-CM | POA: Diagnosis not present

## 2017-10-09 DIAGNOSIS — N390 Urinary tract infection, site not specified: Secondary | ICD-10-CM | POA: Diagnosis not present

## 2017-10-09 DIAGNOSIS — C61 Malignant neoplasm of prostate: Secondary | ICD-10-CM | POA: Diagnosis not present

## 2017-10-15 DIAGNOSIS — R262 Difficulty in walking, not elsewhere classified: Secondary | ICD-10-CM | POA: Diagnosis not present

## 2017-10-15 DIAGNOSIS — M545 Low back pain: Secondary | ICD-10-CM | POA: Diagnosis not present

## 2017-10-15 DIAGNOSIS — Z1339 Encounter for screening examination for other mental health and behavioral disorders: Secondary | ICD-10-CM | POA: Diagnosis not present

## 2017-10-15 DIAGNOSIS — Z6831 Body mass index (BMI) 31.0-31.9, adult: Secondary | ICD-10-CM | POA: Diagnosis not present

## 2017-10-20 DIAGNOSIS — N390 Urinary tract infection, site not specified: Secondary | ICD-10-CM | POA: Diagnosis not present

## 2017-10-25 DIAGNOSIS — M47816 Spondylosis without myelopathy or radiculopathy, lumbar region: Secondary | ICD-10-CM | POA: Diagnosis not present

## 2017-10-25 DIAGNOSIS — R609 Edema, unspecified: Secondary | ICD-10-CM | POA: Diagnosis not present

## 2017-10-25 DIAGNOSIS — S322XXD Fracture of coccyx, subsequent encounter for fracture with routine healing: Secondary | ICD-10-CM | POA: Diagnosis not present

## 2017-10-25 DIAGNOSIS — E876 Hypokalemia: Secondary | ICD-10-CM | POA: Diagnosis not present

## 2017-10-25 DIAGNOSIS — I480 Paroxysmal atrial fibrillation: Secondary | ICD-10-CM | POA: Diagnosis not present

## 2017-10-25 DIAGNOSIS — Z8546 Personal history of malignant neoplasm of prostate: Secondary | ICD-10-CM | POA: Diagnosis not present

## 2017-10-25 DIAGNOSIS — J449 Chronic obstructive pulmonary disease, unspecified: Secondary | ICD-10-CM | POA: Diagnosis not present

## 2017-10-25 DIAGNOSIS — E538 Deficiency of other specified B group vitamins: Secondary | ICD-10-CM | POA: Diagnosis not present

## 2017-10-25 DIAGNOSIS — I2510 Atherosclerotic heart disease of native coronary artery without angina pectoris: Secondary | ICD-10-CM | POA: Diagnosis not present

## 2017-10-28 DIAGNOSIS — E876 Hypokalemia: Secondary | ICD-10-CM | POA: Diagnosis not present

## 2017-10-28 DIAGNOSIS — S322XXD Fracture of coccyx, subsequent encounter for fracture with routine healing: Secondary | ICD-10-CM | POA: Diagnosis not present

## 2017-10-28 DIAGNOSIS — M47816 Spondylosis without myelopathy or radiculopathy, lumbar region: Secondary | ICD-10-CM | POA: Diagnosis not present

## 2017-10-28 DIAGNOSIS — E538 Deficiency of other specified B group vitamins: Secondary | ICD-10-CM | POA: Diagnosis not present

## 2017-10-28 DIAGNOSIS — I2510 Atherosclerotic heart disease of native coronary artery without angina pectoris: Secondary | ICD-10-CM | POA: Diagnosis not present

## 2017-10-28 DIAGNOSIS — I480 Paroxysmal atrial fibrillation: Secondary | ICD-10-CM | POA: Diagnosis not present

## 2017-10-28 DIAGNOSIS — R609 Edema, unspecified: Secondary | ICD-10-CM | POA: Diagnosis not present

## 2017-10-28 DIAGNOSIS — J449 Chronic obstructive pulmonary disease, unspecified: Secondary | ICD-10-CM | POA: Diagnosis not present

## 2017-10-28 DIAGNOSIS — Z8546 Personal history of malignant neoplasm of prostate: Secondary | ICD-10-CM | POA: Diagnosis not present

## 2017-10-29 ENCOUNTER — Ambulatory Visit: Payer: Medicare HMO | Admitting: Cardiovascular Disease

## 2017-10-29 ENCOUNTER — Encounter: Payer: Self-pay | Admitting: Cardiovascular Disease

## 2017-10-29 VITALS — BP 96/62 | HR 68 | Ht 66.0 in | Wt 188.0 lb

## 2017-10-29 DIAGNOSIS — I10 Essential (primary) hypertension: Secondary | ICD-10-CM

## 2017-10-29 DIAGNOSIS — I2510 Atherosclerotic heart disease of native coronary artery without angina pectoris: Secondary | ICD-10-CM | POA: Diagnosis not present

## 2017-10-29 DIAGNOSIS — I350 Nonrheumatic aortic (valve) stenosis: Secondary | ICD-10-CM

## 2017-10-29 DIAGNOSIS — I5032 Chronic diastolic (congestive) heart failure: Secondary | ICD-10-CM

## 2017-10-29 DIAGNOSIS — I4819 Other persistent atrial fibrillation: Secondary | ICD-10-CM

## 2017-10-29 MED ORDER — NITROGLYCERIN 0.4 MG SL SUBL: 0.4000 mg | SUBLINGUAL_TABLET | SUBLINGUAL | 6 refills | Status: AC | PRN

## 2017-10-29 NOTE — Patient Instructions (Signed)

## 2017-10-29 NOTE — Progress Notes (Signed)
Chief Complaint  Patient presents with  . Follow-up    CAD    History of Present Illness: 84 yo male with history of persistent atrial fibrillation, CAD s/p 5V CABG 2000, hyperlipidemia and HTN here today for cardiac follow up. He had 5V CABG in 2000. He had a catheterization done in 2007 which showed patent vein grafts after having an abnormal Myoview scan. His last echocardiogram was in February 2014 and this showed normal LV function, mild AS. He was found to have atrial fibrillation in 2013 and was started on Xarelto.  He is here today for follow up. The patient denies any chest pain, palpitations, orthopnea, PND, dizziness, near syncope or syncope. He has mild LE edam. He has been taking lasix every day. He has baseline dyspnea. He fell off of his walker seat several months ago and broke his coccyx. He is now doing PT.   Primary Care Physician: Lowella Dandy, NP   Past Medical History:  Diagnosis Date  . Atrial fibrillation ([place])   . Coronary artery disease   . Degenerative joint disease   . Hyperlipidemia   . Hypertension     Past Surgical History:  Procedure Laterality Date  . Broken right leg    . CORONARY ARTERY BYPASS GRAFT  2000   with patent grafts in 2004.  Good left ventricular function  . HERNIA REPAIR     multiple  . PROSTATECTOMY      Current Outpatient Medications  Medication Sig Dispense Refill  . acetaminophen (TYLENOL) 325 MG tablet Take 650 mg by mouth every 6 (six) hours as needed (for pain).     Marland Kitchen alendronate (FOSAMAX) 70 MG tablet Take 70 mg by mouth every 7 (seven) days. Take with a full glass of water on an empty stomach.    . Ascorbic Acid (VITAMIN C) 1000 MG tablet Take 1,000 mg by mouth daily.    . calcium carbonate (OSCAL) 1500 (600 Ca) MG TABS tablet Take 600 mg of elemental calcium by mouth 2 (two) times daily with a meal.    . Cholecalciferol (VITAMIN D-3) 1000 UNITS CAPS Take 1,000 Units by mouth daily.     . cyanocobalamin 1000 MCG tablet  Take 1,000 mcg by mouth daily.     Marland Kitchen dextromethorphan-guaiFENesin (MUCINEX DM) 30-600 MG 12hr tablet Take 1 tablet by mouth 2 (two) times daily. 15 tablet 0  . ferrous sulfate 325 (65 FE) MG tablet Take 325 mg by mouth daily with breakfast.    . finasteride (PROSCAR) 5 MG tablet Take 5 mg by mouth daily.    . furosemide (LASIX) 20 MG tablet Take 20 mg by mouth as needed (for swelling).     . Garlic Oil 7673 MG CAPS Take 1 capsule by mouth daily.     . Ipratropium-Albuterol (COMBIVENT RESPIMAT) 20-100 MCG/ACT AERS respimat Inhale 1 puff into the lungs every 6 (six) hours as needed (for wheezing).     . meclizine (ANTIVERT) 25 MG tablet Take 25 mg by mouth daily as needed for dizziness.    . metoprolol tartrate (LOPRESSOR) 25 MG tablet Take 12.5 mg by mouth 2 (two) times daily. Take 1/2 tab twice a day    . Omega-3 Fatty Acids (FISH OIL PO) Take 1 tablet by mouth daily.    Marland Kitchen omeprazole (PRILOSEC) 20 MG capsule Take 20 mg by mouth daily.      . potassium chloride SA (K-DUR,KLOR-CON) 20 MEQ tablet Take 20 mEq by mouth daily.    Marland Kitchen  simvastatin (ZOCOR) 80 MG tablet Take 40 mg by mouth at bedtime.      . tamsulosin (FLOMAX) 0.4 MG CAPS capsule Take 0.2 mg by mouth daily after supper.     . vitamin E 400 UNIT capsule Take 400 Units by mouth daily.      Alveda Reasons 15 MG TABS tablet TAKE 1 TABLET ONCE DAILY. 30 tablet 5  . nitroGLYCERIN (NITROSTAT) 0.4 MG SL tablet Place 1 tablet (0.4 mg total) under the tongue every 5 (five) minutes as needed for chest pain. 25 tablet 6   No current facility-administered medications for this visit.     Allergies  Allergen Reactions  . Codeine Other (See Comments)    Not known     Social History   Socioeconomic History  . Marital status: Widowed    Spouse name: Not on file  . Number of children: 4  . Years of education: Not on file  . Highest education level: Not on file  Occupational History  . Occupation: Regulatory affairs officer: RETIRED    Social Needs  . Financial resource strain: Not on file  . Food insecurity:    Worry: Not on file    Inability: Not on file  . Transportation needs:    Medical: Not on file    Non-medical: Not on file  Tobacco Use  . Smoking status: Former Smoker    Last attempt to quit: 09/13/1949    Years since quitting: 68.1  . Smokeless tobacco: Never Used  Substance and Sexual Activity  . Alcohol use: No  . Drug use: No  . Sexual activity: Not on file  Lifestyle  . Physical activity:    Days per week: Not on file    Minutes per session: Not on file  . Stress: Not on file  Relationships  . Social connections:    Talks on phone: Not on file    Gets together: Not on file    Attends religious service: Not on file    Active member of club or organization: Not on file    Attends meetings of clubs or organizations: Not on file    Relationship status: Not on file  . Intimate partner violence:    Fear of current or ex partner: Not on file    Emotionally abused: Not on file    Physically abused: Not on file    Forced sexual activity: Not on file  Other Topics Concern  . Not on file  Social History Narrative  . Not on file    Family History  Problem Relation Age of Onset  . Heart failure Mother        Age 40  . Stroke Father        Age 20    Review of Systems:  As stated in the HPI and otherwise negative.   BP 96/62   Pulse 68   Ht 5\' 6"  (1.676 m)   Wt 188 lb (85.3 kg)   SpO2 98%   BMI 30.34 kg/m   Physical Examination:  General: Well developed, well nourished, NAD  HEENT: OP clear, mucus membranes moist  SKIN: warm, dry. No rashes. Neuro: No focal deficits  Musculoskeletal: Muscle strength 5/5 all ext  Psychiatric: Mood and affect normal  Neck: No JVD, no carotid bruits, no thyromegaly, no lymphadenopathy.  Lungs:Clear bilaterally, no wheezes, rhonci, crackles Cardiovascular: Irregular irregular. Systolic murmur.  Abdomen:Soft. Bowel sounds present. Non-tender.   Extremities: Trace bilateral LE edema. Pulses  are 2 + in the bilateral DP/PT.  Echo 02/16/12: Left ventricle: The cavity size was normal. Wall thickness was increased in a pattern of mild LVH. There was focal basal hypertrophy. Systolic function was normal. The estimated ejection fraction was in the range of 60% to 65%. - Aortic valve: There was mild stenosis. - Left atrium: The atrium was moderately dilated. - Right atrium: The atrium was mildly dilated.  EKG:  EKG is not ordered today. The ekg ordered today demonstrates   Recent Labs: No results found for requested labs within last 8760 hours.   Lipid Panel No results found for: CHOL, TRIG, HDL, CHOLHDL, VLDL, LDLCALC, LDLDIRECT   Wt Readings from Last 3 Encounters:  10/29/17 188 lb (85.3 kg)  04/30/17 199 lb 9.6 oz (90.5 kg)  08/27/16 207 lb (93.9 kg)     Other studies Reviewed: Additional studies/ records that were reviewed today include: . Review of the above records demonstrates:    Assessment and Plan:   1. CAD s/p CABG without angina: He has no chest pain. Continue statin and beta blocker. No ASA given advanced age and since he is also on Xarelto.   2. Atrial fibrillation, persistent: He is in atrial fibrillation today. Rate is controlled. Will continue beta blocker and Xarelto.    3. Chronic diastolic CHF: Volume status is stable. Weight is stable. Continue Lasix.   4. HTN: BP is controlled. No changes.   5. Aortic stenosis: Echo in 2014 with mild AS. He has a systolic murmur. Given age, will not repeat echo as he would likely not be a candidate for any intervention.   Current medicines are reviewed at length with the patient today.  The patient does not have concerns regarding medicines.  The following changes have been made:  no change  Labs/ tests ordered today include:  No orders of the defined types were placed in this encounter.   Disposition:   FU with me in 6 months  Signed, Lauree Chandler, MD 10/29/2017 2:09 PM    [facility] [address], [place], [place]  [address] Phone: [phone]; Fax: [phone]

## 2017-10-31 DIAGNOSIS — E538 Deficiency of other specified B group vitamins: Secondary | ICD-10-CM | POA: Diagnosis not present

## 2017-10-31 DIAGNOSIS — S322XXD Fracture of coccyx, subsequent encounter for fracture with routine healing: Secondary | ICD-10-CM | POA: Diagnosis not present

## 2017-10-31 DIAGNOSIS — Z8546 Personal history of malignant neoplasm of prostate: Secondary | ICD-10-CM | POA: Diagnosis not present

## 2017-10-31 DIAGNOSIS — I2510 Atherosclerotic heart disease of native coronary artery without angina pectoris: Secondary | ICD-10-CM | POA: Diagnosis not present

## 2017-10-31 DIAGNOSIS — M47816 Spondylosis without myelopathy or radiculopathy, lumbar region: Secondary | ICD-10-CM | POA: Diagnosis not present

## 2017-10-31 DIAGNOSIS — R609 Edema, unspecified: Secondary | ICD-10-CM | POA: Diagnosis not present

## 2017-10-31 DIAGNOSIS — I480 Paroxysmal atrial fibrillation: Secondary | ICD-10-CM | POA: Diagnosis not present

## 2017-10-31 DIAGNOSIS — J449 Chronic obstructive pulmonary disease, unspecified: Secondary | ICD-10-CM | POA: Diagnosis not present

## 2017-10-31 DIAGNOSIS — E876 Hypokalemia: Secondary | ICD-10-CM | POA: Diagnosis not present

## 2017-11-02 DIAGNOSIS — Z8546 Personal history of malignant neoplasm of prostate: Secondary | ICD-10-CM | POA: Diagnosis not present

## 2017-11-02 DIAGNOSIS — S322XXD Fracture of coccyx, subsequent encounter for fracture with routine healing: Secondary | ICD-10-CM | POA: Diagnosis not present

## 2017-11-02 DIAGNOSIS — I480 Paroxysmal atrial fibrillation: Secondary | ICD-10-CM | POA: Diagnosis not present

## 2017-11-02 DIAGNOSIS — I2510 Atherosclerotic heart disease of native coronary artery without angina pectoris: Secondary | ICD-10-CM | POA: Diagnosis not present

## 2017-11-02 DIAGNOSIS — R609 Edema, unspecified: Secondary | ICD-10-CM | POA: Diagnosis not present

## 2017-11-02 DIAGNOSIS — J449 Chronic obstructive pulmonary disease, unspecified: Secondary | ICD-10-CM | POA: Diagnosis not present

## 2017-11-02 DIAGNOSIS — E538 Deficiency of other specified B group vitamins: Secondary | ICD-10-CM | POA: Diagnosis not present

## 2017-11-02 DIAGNOSIS — M47816 Spondylosis without myelopathy or radiculopathy, lumbar region: Secondary | ICD-10-CM | POA: Diagnosis not present

## 2017-11-02 DIAGNOSIS — E876 Hypokalemia: Secondary | ICD-10-CM | POA: Diagnosis not present

## 2017-11-05 DIAGNOSIS — R609 Edema, unspecified: Secondary | ICD-10-CM | POA: Diagnosis not present

## 2017-11-05 DIAGNOSIS — I2510 Atherosclerotic heart disease of native coronary artery without angina pectoris: Secondary | ICD-10-CM | POA: Diagnosis not present

## 2017-11-05 DIAGNOSIS — Z8546 Personal history of malignant neoplasm of prostate: Secondary | ICD-10-CM | POA: Diagnosis not present

## 2017-11-05 DIAGNOSIS — I480 Paroxysmal atrial fibrillation: Secondary | ICD-10-CM | POA: Diagnosis not present

## 2017-11-05 DIAGNOSIS — J449 Chronic obstructive pulmonary disease, unspecified: Secondary | ICD-10-CM | POA: Diagnosis not present

## 2017-11-05 DIAGNOSIS — S322XXD Fracture of coccyx, subsequent encounter for fracture with routine healing: Secondary | ICD-10-CM | POA: Diagnosis not present

## 2017-11-05 DIAGNOSIS — E538 Deficiency of other specified B group vitamins: Secondary | ICD-10-CM | POA: Diagnosis not present

## 2017-11-05 DIAGNOSIS — M47816 Spondylosis without myelopathy or radiculopathy, lumbar region: Secondary | ICD-10-CM | POA: Diagnosis not present

## 2017-11-05 DIAGNOSIS — E876 Hypokalemia: Secondary | ICD-10-CM | POA: Diagnosis not present

## 2017-11-09 DIAGNOSIS — J449 Chronic obstructive pulmonary disease, unspecified: Secondary | ICD-10-CM | POA: Diagnosis not present

## 2017-11-09 DIAGNOSIS — I480 Paroxysmal atrial fibrillation: Secondary | ICD-10-CM | POA: Diagnosis not present

## 2017-11-09 DIAGNOSIS — Z8546 Personal history of malignant neoplasm of prostate: Secondary | ICD-10-CM | POA: Diagnosis not present

## 2017-11-09 DIAGNOSIS — M47816 Spondylosis without myelopathy or radiculopathy, lumbar region: Secondary | ICD-10-CM | POA: Diagnosis not present

## 2017-11-09 DIAGNOSIS — I2510 Atherosclerotic heart disease of native coronary artery without angina pectoris: Secondary | ICD-10-CM | POA: Diagnosis not present

## 2017-11-09 DIAGNOSIS — E876 Hypokalemia: Secondary | ICD-10-CM | POA: Diagnosis not present

## 2017-11-09 DIAGNOSIS — S322XXD Fracture of coccyx, subsequent encounter for fracture with routine healing: Secondary | ICD-10-CM | POA: Diagnosis not present

## 2017-11-09 DIAGNOSIS — E538 Deficiency of other specified B group vitamins: Secondary | ICD-10-CM | POA: Diagnosis not present

## 2017-11-09 DIAGNOSIS — R609 Edema, unspecified: Secondary | ICD-10-CM | POA: Diagnosis not present

## 2017-11-10 DIAGNOSIS — E876 Hypokalemia: Secondary | ICD-10-CM | POA: Diagnosis not present

## 2017-11-10 DIAGNOSIS — E538 Deficiency of other specified B group vitamins: Secondary | ICD-10-CM | POA: Diagnosis not present

## 2017-11-10 DIAGNOSIS — I2510 Atherosclerotic heart disease of native coronary artery without angina pectoris: Secondary | ICD-10-CM | POA: Diagnosis not present

## 2017-11-10 DIAGNOSIS — I480 Paroxysmal atrial fibrillation: Secondary | ICD-10-CM | POA: Diagnosis not present

## 2017-11-10 DIAGNOSIS — M47816 Spondylosis without myelopathy or radiculopathy, lumbar region: Secondary | ICD-10-CM | POA: Diagnosis not present

## 2017-11-10 DIAGNOSIS — S322XXD Fracture of coccyx, subsequent encounter for fracture with routine healing: Secondary | ICD-10-CM | POA: Diagnosis not present

## 2017-11-10 DIAGNOSIS — Z8546 Personal history of malignant neoplasm of prostate: Secondary | ICD-10-CM | POA: Diagnosis not present

## 2017-11-10 DIAGNOSIS — R609 Edema, unspecified: Secondary | ICD-10-CM | POA: Diagnosis not present

## 2017-11-10 DIAGNOSIS — J449 Chronic obstructive pulmonary disease, unspecified: Secondary | ICD-10-CM | POA: Diagnosis not present

## 2017-11-12 ENCOUNTER — Telehealth: Payer: Self-pay | Admitting: *Deleted

## 2017-11-12 DIAGNOSIS — I2510 Atherosclerotic heart disease of native coronary artery without angina pectoris: Secondary | ICD-10-CM | POA: Diagnosis not present

## 2017-11-12 DIAGNOSIS — R609 Edema, unspecified: Secondary | ICD-10-CM | POA: Diagnosis not present

## 2017-11-12 DIAGNOSIS — Z8546 Personal history of malignant neoplasm of prostate: Secondary | ICD-10-CM | POA: Diagnosis not present

## 2017-11-12 DIAGNOSIS — J449 Chronic obstructive pulmonary disease, unspecified: Secondary | ICD-10-CM | POA: Diagnosis not present

## 2017-11-12 DIAGNOSIS — S322XXD Fracture of coccyx, subsequent encounter for fracture with routine healing: Secondary | ICD-10-CM | POA: Diagnosis not present

## 2017-11-12 DIAGNOSIS — M47816 Spondylosis without myelopathy or radiculopathy, lumbar region: Secondary | ICD-10-CM | POA: Diagnosis not present

## 2017-11-12 DIAGNOSIS — I480 Paroxysmal atrial fibrillation: Secondary | ICD-10-CM | POA: Diagnosis not present

## 2017-11-12 DIAGNOSIS — E538 Deficiency of other specified B group vitamins: Secondary | ICD-10-CM | POA: Diagnosis not present

## 2017-11-12 DIAGNOSIS — E876 Hypokalemia: Secondary | ICD-10-CM | POA: Diagnosis not present

## 2017-11-12 NOTE — Telephone Encounter (Signed)
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 He works again 11/20/2017.

## 2017-11-16 DIAGNOSIS — I2510 Atherosclerotic heart disease of native coronary artery without angina pectoris: Secondary | ICD-10-CM | POA: Diagnosis not present

## 2017-11-16 DIAGNOSIS — R609 Edema, unspecified: Secondary | ICD-10-CM | POA: Diagnosis not present

## 2017-11-16 DIAGNOSIS — I480 Paroxysmal atrial fibrillation: Secondary | ICD-10-CM | POA: Diagnosis not present

## 2017-11-16 DIAGNOSIS — Z8546 Personal history of malignant neoplasm of prostate: Secondary | ICD-10-CM | POA: Diagnosis not present

## 2017-11-16 DIAGNOSIS — E876 Hypokalemia: Secondary | ICD-10-CM | POA: Diagnosis not present

## 2017-11-16 DIAGNOSIS — M47816 Spondylosis without myelopathy or radiculopathy, lumbar region: Secondary | ICD-10-CM | POA: Diagnosis not present

## 2017-11-16 DIAGNOSIS — S322XXD Fracture of coccyx, subsequent encounter for fracture with routine healing: Secondary | ICD-10-CM | POA: Diagnosis not present

## 2017-11-16 DIAGNOSIS — J449 Chronic obstructive pulmonary disease, unspecified: Secondary | ICD-10-CM | POA: Diagnosis not present

## 2017-11-16 DIAGNOSIS — E538 Deficiency of other specified B group vitamins: Secondary | ICD-10-CM | POA: Diagnosis not present

## 2017-11-18 DIAGNOSIS — Z8546 Personal history of malignant neoplasm of prostate: Secondary | ICD-10-CM | POA: Diagnosis not present

## 2017-11-18 DIAGNOSIS — I480 Paroxysmal atrial fibrillation: Secondary | ICD-10-CM | POA: Diagnosis not present

## 2017-11-18 DIAGNOSIS — E538 Deficiency of other specified B group vitamins: Secondary | ICD-10-CM | POA: Diagnosis not present

## 2017-11-18 DIAGNOSIS — I2510 Atherosclerotic heart disease of native coronary artery without angina pectoris: Secondary | ICD-10-CM | POA: Diagnosis not present

## 2017-11-18 DIAGNOSIS — R609 Edema, unspecified: Secondary | ICD-10-CM | POA: Diagnosis not present

## 2017-11-18 DIAGNOSIS — S322XXD Fracture of coccyx, subsequent encounter for fracture with routine healing: Secondary | ICD-10-CM | POA: Diagnosis not present

## 2017-11-18 DIAGNOSIS — J449 Chronic obstructive pulmonary disease, unspecified: Secondary | ICD-10-CM | POA: Diagnosis not present

## 2017-11-18 DIAGNOSIS — E876 Hypokalemia: Secondary | ICD-10-CM | POA: Diagnosis not present

## 2017-11-18 DIAGNOSIS — M47816 Spondylosis without myelopathy or radiculopathy, lumbar region: Secondary | ICD-10-CM | POA: Diagnosis not present

## 2017-11-19 NOTE — Telephone Encounter (Signed)
I faxed completed Wynetta Emery and [facility] patient assistance application for help with XARELTO.

## 2017-11-23 DIAGNOSIS — I2510 Atherosclerotic heart disease of native coronary artery without angina pectoris: Secondary | ICD-10-CM | POA: Diagnosis not present

## 2017-11-23 DIAGNOSIS — E876 Hypokalemia: Secondary | ICD-10-CM | POA: Diagnosis not present

## 2017-11-23 DIAGNOSIS — M47816 Spondylosis without myelopathy or radiculopathy, lumbar region: Secondary | ICD-10-CM | POA: Diagnosis not present

## 2017-11-23 DIAGNOSIS — E538 Deficiency of other specified B group vitamins: Secondary | ICD-10-CM | POA: Diagnosis not present

## 2017-11-23 DIAGNOSIS — Z8546 Personal history of malignant neoplasm of prostate: Secondary | ICD-10-CM | POA: Diagnosis not present

## 2017-11-23 DIAGNOSIS — J449 Chronic obstructive pulmonary disease, unspecified: Secondary | ICD-10-CM | POA: Diagnosis not present

## 2017-11-23 DIAGNOSIS — I480 Paroxysmal atrial fibrillation: Secondary | ICD-10-CM | POA: Diagnosis not present

## 2017-11-23 DIAGNOSIS — R609 Edema, unspecified: Secondary | ICD-10-CM | POA: Diagnosis not present

## 2017-11-23 DIAGNOSIS — S322XXD Fracture of coccyx, subsequent encounter for fracture with routine healing: Secondary | ICD-10-CM | POA: Diagnosis not present

## 2017-11-24 DIAGNOSIS — E538 Deficiency of other specified B group vitamins: Secondary | ICD-10-CM | POA: Diagnosis not present

## 2017-11-24 DIAGNOSIS — I480 Paroxysmal atrial fibrillation: Secondary | ICD-10-CM | POA: Diagnosis not present

## 2017-11-24 DIAGNOSIS — J449 Chronic obstructive pulmonary disease, unspecified: Secondary | ICD-10-CM | POA: Diagnosis not present

## 2017-11-24 DIAGNOSIS — R609 Edema, unspecified: Secondary | ICD-10-CM | POA: Diagnosis not present

## 2017-11-24 DIAGNOSIS — S322XXD Fracture of coccyx, subsequent encounter for fracture with routine healing: Secondary | ICD-10-CM | POA: Diagnosis not present

## 2017-11-24 DIAGNOSIS — I2510 Atherosclerotic heart disease of native coronary artery without angina pectoris: Secondary | ICD-10-CM | POA: Diagnosis not present

## 2017-11-24 DIAGNOSIS — E876 Hypokalemia: Secondary | ICD-10-CM | POA: Diagnosis not present

## 2017-11-24 DIAGNOSIS — M47816 Spondylosis without myelopathy or radiculopathy, lumbar region: Secondary | ICD-10-CM | POA: Diagnosis not present

## 2017-11-24 DIAGNOSIS — Z8546 Personal history of malignant neoplasm of prostate: Secondary | ICD-10-CM | POA: Diagnosis not present

## 2017-12-01 DIAGNOSIS — M47816 Spondylosis without myelopathy or radiculopathy, lumbar region: Secondary | ICD-10-CM | POA: Diagnosis not present

## 2017-12-01 DIAGNOSIS — I2510 Atherosclerotic heart disease of native coronary artery without angina pectoris: Secondary | ICD-10-CM | POA: Diagnosis not present

## 2017-12-01 DIAGNOSIS — S322XXD Fracture of coccyx, subsequent encounter for fracture with routine healing: Secondary | ICD-10-CM | POA: Diagnosis not present

## 2017-12-01 DIAGNOSIS — E538 Deficiency of other specified B group vitamins: Secondary | ICD-10-CM | POA: Diagnosis not present

## 2017-12-01 DIAGNOSIS — J449 Chronic obstructive pulmonary disease, unspecified: Secondary | ICD-10-CM | POA: Diagnosis not present

## 2017-12-01 DIAGNOSIS — I480 Paroxysmal atrial fibrillation: Secondary | ICD-10-CM | POA: Diagnosis not present

## 2017-12-01 DIAGNOSIS — R609 Edema, unspecified: Secondary | ICD-10-CM | POA: Diagnosis not present

## 2017-12-01 DIAGNOSIS — E876 Hypokalemia: Secondary | ICD-10-CM | POA: Diagnosis not present

## 2017-12-01 DIAGNOSIS — Z8546 Personal history of malignant neoplasm of prostate: Secondary | ICD-10-CM | POA: Diagnosis not present

## 2017-12-03 DIAGNOSIS — E876 Hypokalemia: Secondary | ICD-10-CM | POA: Diagnosis not present

## 2017-12-03 DIAGNOSIS — R609 Edema, unspecified: Secondary | ICD-10-CM | POA: Diagnosis not present

## 2017-12-03 DIAGNOSIS — S322XXD Fracture of coccyx, subsequent encounter for fracture with routine healing: Secondary | ICD-10-CM | POA: Diagnosis not present

## 2017-12-03 DIAGNOSIS — I480 Paroxysmal atrial fibrillation: Secondary | ICD-10-CM | POA: Diagnosis not present

## 2017-12-03 DIAGNOSIS — E538 Deficiency of other specified B group vitamins: Secondary | ICD-10-CM | POA: Diagnosis not present

## 2017-12-03 DIAGNOSIS — I2510 Atherosclerotic heart disease of native coronary artery without angina pectoris: Secondary | ICD-10-CM | POA: Diagnosis not present

## 2017-12-03 DIAGNOSIS — Z8546 Personal history of malignant neoplasm of prostate: Secondary | ICD-10-CM | POA: Diagnosis not present

## 2017-12-03 DIAGNOSIS — J449 Chronic obstructive pulmonary disease, unspecified: Secondary | ICD-10-CM | POA: Diagnosis not present

## 2017-12-03 DIAGNOSIS — M47816 Spondylosis without myelopathy or radiculopathy, lumbar region: Secondary | ICD-10-CM | POA: Diagnosis not present

## 2017-12-08 DIAGNOSIS — R609 Edema, unspecified: Secondary | ICD-10-CM | POA: Diagnosis not present

## 2017-12-08 DIAGNOSIS — Z8546 Personal history of malignant neoplasm of prostate: Secondary | ICD-10-CM | POA: Diagnosis not present

## 2017-12-08 DIAGNOSIS — I2510 Atherosclerotic heart disease of native coronary artery without angina pectoris: Secondary | ICD-10-CM | POA: Diagnosis not present

## 2017-12-08 DIAGNOSIS — I480 Paroxysmal atrial fibrillation: Secondary | ICD-10-CM | POA: Diagnosis not present

## 2017-12-08 DIAGNOSIS — S322XXD Fracture of coccyx, subsequent encounter for fracture with routine healing: Secondary | ICD-10-CM | POA: Diagnosis not present

## 2017-12-08 DIAGNOSIS — E538 Deficiency of other specified B group vitamins: Secondary | ICD-10-CM | POA: Diagnosis not present

## 2017-12-08 DIAGNOSIS — E876 Hypokalemia: Secondary | ICD-10-CM | POA: Diagnosis not present

## 2017-12-08 DIAGNOSIS — J449 Chronic obstructive pulmonary disease, unspecified: Secondary | ICD-10-CM | POA: Diagnosis not present

## 2017-12-08 DIAGNOSIS — M47816 Spondylosis without myelopathy or radiculopathy, lumbar region: Secondary | ICD-10-CM | POA: Diagnosis not present

## 2017-12-15 DIAGNOSIS — I480 Paroxysmal atrial fibrillation: Secondary | ICD-10-CM | POA: Diagnosis not present

## 2017-12-15 DIAGNOSIS — M47816 Spondylosis without myelopathy or radiculopathy, lumbar region: Secondary | ICD-10-CM | POA: Diagnosis not present

## 2017-12-15 DIAGNOSIS — E876 Hypokalemia: Secondary | ICD-10-CM | POA: Diagnosis not present

## 2017-12-15 DIAGNOSIS — Z8546 Personal history of malignant neoplasm of prostate: Secondary | ICD-10-CM | POA: Diagnosis not present

## 2017-12-15 DIAGNOSIS — E538 Deficiency of other specified B group vitamins: Secondary | ICD-10-CM | POA: Diagnosis not present

## 2017-12-15 DIAGNOSIS — I2510 Atherosclerotic heart disease of native coronary artery without angina pectoris: Secondary | ICD-10-CM | POA: Diagnosis not present

## 2017-12-15 DIAGNOSIS — J449 Chronic obstructive pulmonary disease, unspecified: Secondary | ICD-10-CM | POA: Diagnosis not present

## 2017-12-15 DIAGNOSIS — R609 Edema, unspecified: Secondary | ICD-10-CM | POA: Diagnosis not present

## 2017-12-15 DIAGNOSIS — S322XXD Fracture of coccyx, subsequent encounter for fracture with routine healing: Secondary | ICD-10-CM | POA: Diagnosis not present

## 2017-12-18 ENCOUNTER — Other Ambulatory Visit: Payer: Self-pay | Admitting: Cardiovascular Disease

## 2017-12-18 NOTE — Telephone Encounter (Signed)
Pt. Is a 84 yr old male who saw Dr. Angelena Form on 10/29/17, wt at that visit was 85.3KG. Last SCr on 01/01/17 was 1.37. CrCL is 35 mL/min. Will refill Xarelto 15mg  QD

## 2017-12-22 DIAGNOSIS — M47816 Spondylosis without myelopathy or radiculopathy, lumbar region: Secondary | ICD-10-CM | POA: Diagnosis not present

## 2017-12-22 DIAGNOSIS — I480 Paroxysmal atrial fibrillation: Secondary | ICD-10-CM | POA: Diagnosis not present

## 2017-12-22 DIAGNOSIS — R609 Edema, unspecified: Secondary | ICD-10-CM | POA: Diagnosis not present

## 2017-12-22 DIAGNOSIS — E876 Hypokalemia: Secondary | ICD-10-CM | POA: Diagnosis not present

## 2017-12-22 DIAGNOSIS — E538 Deficiency of other specified B group vitamins: Secondary | ICD-10-CM | POA: Diagnosis not present

## 2017-12-22 DIAGNOSIS — I2510 Atherosclerotic heart disease of native coronary artery without angina pectoris: Secondary | ICD-10-CM | POA: Diagnosis not present

## 2017-12-22 DIAGNOSIS — S322XXD Fracture of coccyx, subsequent encounter for fracture with routine healing: Secondary | ICD-10-CM | POA: Diagnosis not present

## 2017-12-22 DIAGNOSIS — Z8546 Personal history of malignant neoplasm of prostate: Secondary | ICD-10-CM | POA: Diagnosis not present

## 2017-12-22 DIAGNOSIS — J449 Chronic obstructive pulmonary disease, unspecified: Secondary | ICD-10-CM | POA: Diagnosis not present

## 2018-01-08 DIAGNOSIS — E876 Hypokalemia: Secondary | ICD-10-CM | POA: Diagnosis not present

## 2018-01-08 DIAGNOSIS — E785 Hyperlipidemia, unspecified: Secondary | ICD-10-CM | POA: Diagnosis not present

## 2018-01-08 DIAGNOSIS — Z6830 Body mass index (BMI) 30.0-30.9, adult: Secondary | ICD-10-CM | POA: Diagnosis not present

## 2018-01-08 DIAGNOSIS — M47816 Spondylosis without myelopathy or radiculopathy, lumbar region: Secondary | ICD-10-CM | POA: Diagnosis not present

## 2018-01-08 DIAGNOSIS — J449 Chronic obstructive pulmonary disease, unspecified: Secondary | ICD-10-CM | POA: Diagnosis not present

## 2018-01-08 DIAGNOSIS — I10 Essential (primary) hypertension: Secondary | ICD-10-CM | POA: Diagnosis not present

## 2018-01-08 DIAGNOSIS — D539 Nutritional anemia, unspecified: Secondary | ICD-10-CM | POA: Diagnosis not present

## 2018-01-08 DIAGNOSIS — I480 Paroxysmal atrial fibrillation: Secondary | ICD-10-CM | POA: Diagnosis not present

## 2018-01-28 DIAGNOSIS — S31801A Laceration without foreign body of unspecified buttock, initial encounter: Secondary | ICD-10-CM | POA: Diagnosis not present

## 2018-01-28 DIAGNOSIS — N390 Urinary tract infection, site not specified: Secondary | ICD-10-CM | POA: Diagnosis not present

## 2018-01-28 DIAGNOSIS — C61 Malignant neoplasm of prostate: Secondary | ICD-10-CM | POA: Diagnosis not present

## 2018-01-28 DIAGNOSIS — R351 Nocturia: Secondary | ICD-10-CM | POA: Diagnosis not present

## 2018-02-16 DIAGNOSIS — Z6830 Body mass index (BMI) 30.0-30.9, adult: Secondary | ICD-10-CM | POA: Diagnosis not present

## 2018-02-16 DIAGNOSIS — I10 Essential (primary) hypertension: Secondary | ICD-10-CM | POA: Diagnosis not present

## 2018-02-16 DIAGNOSIS — D539 Nutritional anemia, unspecified: Secondary | ICD-10-CM | POA: Diagnosis not present

## 2018-04-09 ENCOUNTER — Other Ambulatory Visit: Payer: Self-pay | Admitting: Cardiovascular Disease

## 2018-04-13 DIAGNOSIS — J449 Chronic obstructive pulmonary disease, unspecified: Secondary | ICD-10-CM | POA: Diagnosis not present

## 2018-04-13 DIAGNOSIS — I10 Essential (primary) hypertension: Secondary | ICD-10-CM | POA: Diagnosis not present

## 2018-04-13 DIAGNOSIS — M47816 Spondylosis without myelopathy or radiculopathy, lumbar region: Secondary | ICD-10-CM | POA: Diagnosis not present

## 2018-04-21 ENCOUNTER — Telehealth: Payer: Self-pay

## 2018-04-21 NOTE — Telephone Encounter (Signed)
Patient rescheduled with Dr. Angelena Form on 08/19/18. He will let us know if he develops any Sx before then

## 2018-04-21 NOTE — Telephone Encounter (Signed)
Pt declined video/phone visit would prefer to have OV in August.

## 2018-04-27 ENCOUNTER — Ambulatory Visit: Payer: Medicare HMO | Admitting: Physician Assistant

## 2018-05-17 DIAGNOSIS — Z6830 Body mass index (BMI) 30.0-30.9, adult: Secondary | ICD-10-CM | POA: Diagnosis not present

## 2018-05-17 DIAGNOSIS — Z9181 History of falling: Secondary | ICD-10-CM | POA: Diagnosis not present

## 2018-05-17 DIAGNOSIS — Z136 Encounter for screening for cardiovascular disorders: Secondary | ICD-10-CM | POA: Diagnosis not present

## 2018-05-17 DIAGNOSIS — Z0000 Encounter for general adult medical examination without abnormal findings: Secondary | ICD-10-CM | POA: Diagnosis not present

## 2018-05-17 DIAGNOSIS — E785 Hyperlipidemia, unspecified: Secondary | ICD-10-CM | POA: Diagnosis not present

## 2018-05-17 DIAGNOSIS — E669 Obesity, unspecified: Secondary | ICD-10-CM | POA: Diagnosis not present

## 2018-05-17 DIAGNOSIS — Z23 Encounter for immunization: Secondary | ICD-10-CM | POA: Diagnosis not present

## 2018-05-17 DIAGNOSIS — Z1331 Encounter for screening for depression: Secondary | ICD-10-CM | POA: Diagnosis not present

## 2018-06-15 DIAGNOSIS — C61 Malignant neoplasm of prostate: Secondary | ICD-10-CM | POA: Diagnosis not present

## 2018-06-15 DIAGNOSIS — R351 Nocturia: Secondary | ICD-10-CM | POA: Diagnosis not present

## 2018-07-05 DIAGNOSIS — D539 Nutritional anemia, unspecified: Secondary | ICD-10-CM | POA: Diagnosis not present

## 2018-07-05 DIAGNOSIS — I480 Paroxysmal atrial fibrillation: Secondary | ICD-10-CM | POA: Diagnosis not present

## 2018-07-05 DIAGNOSIS — J449 Chronic obstructive pulmonary disease, unspecified: Secondary | ICD-10-CM | POA: Diagnosis not present

## 2018-07-05 DIAGNOSIS — I10 Essential (primary) hypertension: Secondary | ICD-10-CM | POA: Diagnosis not present

## 2018-07-05 DIAGNOSIS — Z6830 Body mass index (BMI) 30.0-30.9, adult: Secondary | ICD-10-CM | POA: Diagnosis not present

## 2018-07-05 DIAGNOSIS — M47816 Spondylosis without myelopathy or radiculopathy, lumbar region: Secondary | ICD-10-CM | POA: Diagnosis not present

## 2018-07-08 ENCOUNTER — Other Ambulatory Visit: Payer: Self-pay | Admitting: Cardiovascular Disease

## 2018-07-08 NOTE — Telephone Encounter (Addendum)
Prescription refill request received for Xarelto. Pt is overdue for blood work. Called pt's PCP who stated pt recently had blood work done on 07/05/2018 and would fax [place] the results once  it had been reviewed which would probably be on Friday (07/09/2018) or  Monday (07/12/2018). Called pt  who stated he had enough Xarelto to get him through next week. Will refill prescription when lab work is received.   Labs received on 07/08/2018 at 1600.   Last office visit: Dr. Angelena Form (10-29-2017) Weight: 85.3 kg (10-29-2017) Age: 85 y.o. Scr: 1.29 (07/05/2018 faxed from PCP office) CrCl: 38 ml/min  Prescription refill sent.

## 2018-08-19 ENCOUNTER — Ambulatory Visit (INDEPENDENT_AMBULATORY_CARE_PROVIDER_SITE_OTHER): Payer: Medicare HMO | Admitting: Cardiovascular Disease

## 2018-08-19 ENCOUNTER — Other Ambulatory Visit: Payer: Self-pay

## 2018-08-19 ENCOUNTER — Encounter: Payer: Self-pay | Admitting: Cardiovascular Disease

## 2018-08-19 VITALS — BP 102/70 | HR 76 | Ht 66.0 in | Wt 190.8 lb

## 2018-08-19 DIAGNOSIS — I10 Essential (primary) hypertension: Secondary | ICD-10-CM | POA: Diagnosis not present

## 2018-08-19 DIAGNOSIS — I2510 Atherosclerotic heart disease of native coronary artery without angina pectoris: Secondary | ICD-10-CM | POA: Diagnosis not present

## 2018-08-19 DIAGNOSIS — I4819 Other persistent atrial fibrillation: Secondary | ICD-10-CM

## 2018-08-19 DIAGNOSIS — I5032 Chronic diastolic (congestive) heart failure: Secondary | ICD-10-CM

## 2018-08-19 DIAGNOSIS — I350 Nonrheumatic aortic (valve) stenosis: Secondary | ICD-10-CM

## 2018-08-19 NOTE — Progress Notes (Signed)
Chief Complaint  Patient presents with  . Follow-up    CAD    History of Present Illness: 85 yo male with history of persistent atrial fibrillation, CAD s/p 5V CABG 2000, hyperlipidemia and HTN here today for cardiac follow up. He had 5V CABG in 2000. He had a catheterization done in 2007 which showed patent vein grafts after having an abnormal Myoview scan. His last echocardiogram was in February 2014 and this showed normal LV function, mild AS. He was found to have atrial fibrillation in 2013 and was started on Xarelto. He has had mild lower extremity edema over the years controlled with daily Lasix.   He is here today for follow up. The patient denies any chest pain, dyspnea, palpitations, lower extremity edema, orthopnea, PND, dizziness, near syncope or syncope. He mowed two acres of grass yesterday.    Primary Care Physician: Lowella Dandy, NP  Past Medical History:  Diagnosis Date  . Atrial fibrillation ([place])   . Coronary artery disease   . Degenerative joint disease   . Hyperlipidemia   . Hypertension     Past Surgical History:  Procedure Laterality Date  . Broken right leg    . CORONARY ARTERY BYPASS GRAFT  2000   with patent grafts in 2004.  Good left ventricular function  . HERNIA REPAIR     multiple  . PROSTATECTOMY      Current Outpatient Medications  Medication Sig Dispense Refill  . acetaminophen (TYLENOL) 325 MG tablet Take 650 mg by mouth every 6 (six) hours as needed (for pain).     Marland Kitchen alendronate (FOSAMAX) 70 MG tablet Take 70 mg by mouth every 7 (seven) days. Take with a full glass of water on an empty stomach.    . Ascorbic Acid (VITAMIN C) 1000 MG tablet Take 1,000 mg by mouth daily.    . calcium carbonate (OSCAL) 1500 (600 Ca) MG TABS tablet Take 600 mg of elemental calcium by mouth 2 (two) times daily with a meal.    . Cholecalciferol (VITAMIN D-3) 1000 UNITS CAPS Take 1,000 Units by mouth daily.     . cyanocobalamin 1000 MCG tablet Take 1,000 mcg by  mouth daily.     Marland Kitchen dextromethorphan-guaiFENesin (MUCINEX DM) 30-600 MG 12hr tablet Take 1 tablet by mouth 2 (two) times daily. 15 tablet 0  . ferrous sulfate 325 (65 FE) MG tablet Take 325 mg by mouth daily with breakfast.    . finasteride (PROSCAR) 5 MG tablet Take 5 mg by mouth daily.    . furosemide (LASIX) 20 MG tablet Take 20 mg by mouth as needed (for swelling).     . Garlic Oil 9833 MG CAPS Take 1 capsule by mouth daily.     . Ipratropium-Albuterol (COMBIVENT RESPIMAT) 20-100 MCG/ACT AERS respimat Inhale 1 puff into the lungs every 6 (six) hours as needed (for wheezing).     . meclizine (ANTIVERT) 25 MG tablet Take 25 mg by mouth daily as needed for dizziness.    . metoprolol tartrate (LOPRESSOR) 25 MG tablet Take 12.5 mg by mouth 2 (two) times daily. Take 1/2 tab twice a day    . Omega-3 Fatty Acids (FISH OIL PO) Take 1 tablet by mouth daily.    Marland Kitchen omeprazole (PRILOSEC) 20 MG capsule Take 20 mg by mouth daily.      . potassium chloride SA (K-DUR,KLOR-CON) 20 MEQ tablet Take 20 mEq by mouth daily.    . simvastatin (ZOCOR) 80 MG tablet Take 40 mg  by mouth at bedtime.      . tamsulosin (FLOMAX) 0.4 MG CAPS capsule Take 0.2 mg by mouth daily after supper.     . vitamin E 400 UNIT capsule Take 400 Units by mouth daily.      Alveda Reasons 15 MG TABS tablet TAKE 1 TABLET ONCE DAILY WITH LARGEST MEAL OF DAY. 30 tablet 5  . nitroGLYCERIN (NITROSTAT) 0.4 MG SL tablet Place 1 tablet (0.4 mg total) under the tongue every 5 (five) minutes as needed for chest pain. 25 tablet 6   No current facility-administered medications for this visit.     Allergies  Allergen Reactions  . Codeine Other (See Comments)    Not known     Social History   Socioeconomic History  . Marital status: Widowed    Spouse name: Not on file  . Number of children: 4  . Years of education: Not on file  . Highest education level: Not on file  Occupational History  . Occupation: Regulatory affairs officer:  RETIRED  Social Needs  . Financial resource strain: Not on file  . Food insecurity    Worry: Not on file    Inability: Not on file  . Transportation needs    Medical: Not on file    Non-medical: Not on file  Tobacco Use  . Smoking status: Former Smoker    Quit date: 09/13/1949    Years since quitting: 68.9  . Smokeless tobacco: Never Used  Substance and Sexual Activity  . Alcohol use: No  . Drug use: No  . Sexual activity: Not on file  Lifestyle  . Physical activity    Days per week: Not on file    Minutes per session: Not on file  . Stress: Not on file  Relationships  . Social Herbalist on phone: Not on file    Gets together: Not on file    Attends religious service: Not on file    Active member of club or organization: Not on file    Attends meetings of clubs or organizations: Not on file    Relationship status: Not on file  . Intimate partner violence    Fear of current or ex partner: Not on file    Emotionally abused: Not on file    Physically abused: Not on file    Forced sexual activity: Not on file  Other Topics Concern  . Not on file  Social History Narrative  . Not on file    Family History  Problem Relation Age of Onset  . Heart failure Mother        Age 65  . Stroke Father        Age 12    Review of Systems:  As stated in the HPI and otherwise negative.   BP 102/70   Pulse 76   Ht 5\' 6"  (1.676 m)   Wt 190 lb 12.8 oz (86.5 kg)   SpO2 98%   BMI 30.80 kg/m   Physical Examination:  General: Well developed, well nourished, NAD  HEENT: OP clear, mucus membranes moist  SKIN: warm, dry. No rashes. Neuro: No focal deficits  Musculoskeletal: Muscle strength 5/5 all ext  Psychiatric: Mood and affect normal  Neck: No JVD, no carotid bruits, no thyromegaly, no lymphadenopathy.  Lungs:Clear bilaterally, no wheezes, rhonci, crackles Cardiovascular: irreg irreg. Systolic murmur. . Abdomen:Soft. Bowel sounds present. Non-tender.  Extremities:  No lower extremity edema. Pulses are 2 + in  the bilateral DP/PT.  Echo 02/16/12: Left ventricle: The cavity size was normal. Wall thickness was increased in a pattern of mild LVH. There was focal basal hypertrophy. Systolic function was normal. The estimated ejection fraction was in the range of 60% to 65%. - Aortic valve: There was mild stenosis. - Left atrium: The atrium was moderately dilated. - Right atrium: The atrium was mildly dilated.  EKG:  EKG is ordered today. The ekg ordered today demonstrates Atrial fib, rate 76 bpm. LVH. Old inferior MI  Recent Labs: No results found for requested labs within last 8760 hours.   Lipid Panel No results found for: CHOL, TRIG, HDL, CHOLHDL, VLDL, LDLCALC, LDLDIRECT   Wt Readings from Last 3 Encounters:  08/19/18 190 lb 12.8 oz (86.5 kg)  10/29/17 188 lb (85.3 kg)  04/30/17 199 lb 9.6 oz (90.5 kg)     Other studies Reviewed: Additional studies/ records that were reviewed today include: . Review of the above records demonstrates:    Assessment and Plan:   1. CAD s/p CABG without angina: No chest pain. No ASA given advanced age and since he is also on Xarelto. Will continue his beta blocker and statin. No plans for ischemic testing given his advanced age.   2. Atrial fibrillation, persistent: He is in atrial fib today. His HR is controlled. Continue beta blocker and Xarelto.     3. Chronic diastolic CHF: Weight is stable today. No signs of volume overload. Continue daily Lasix.    4. HTN: His BP is well controlled. No changes  5. Aortic stenosis: Echo in 2014 with mild AS. Systolic murmur on exam. No plans for repeat echo given advanced age and limited options for treatment because of his age.   Current medicines are reviewed at length with the patient today.  The patient does not have concerns regarding medicines.  The following changes have been made:  no change  Labs/ tests ordered today include:   Orders Placed This  Encounter  Procedures  . EKG 12-Lead    Disposition:   FU with me in 6 months  Signed, Lauree Chandler, MD 08/19/2018 12:10 PM    [facility] [address], [place], [place]  [address] Phone: [phone]; Fax: [phone]

## 2018-08-19 NOTE — Patient Instructions (Signed)

## 2018-09-03 DIAGNOSIS — I480 Paroxysmal atrial fibrillation: Secondary | ICD-10-CM | POA: Diagnosis not present

## 2018-09-03 DIAGNOSIS — M47816 Spondylosis without myelopathy or radiculopathy, lumbar region: Secondary | ICD-10-CM | POA: Diagnosis not present

## 2018-09-03 DIAGNOSIS — Z139 Encounter for screening, unspecified: Secondary | ICD-10-CM | POA: Diagnosis not present

## 2018-09-03 DIAGNOSIS — Z6830 Body mass index (BMI) 30.0-30.9, adult: Secondary | ICD-10-CM | POA: Diagnosis not present

## 2018-09-03 DIAGNOSIS — I10 Essential (primary) hypertension: Secondary | ICD-10-CM | POA: Diagnosis not present

## 2018-09-03 DIAGNOSIS — D539 Nutritional anemia, unspecified: Secondary | ICD-10-CM | POA: Diagnosis not present

## 2018-09-03 DIAGNOSIS — J449 Chronic obstructive pulmonary disease, unspecified: Secondary | ICD-10-CM | POA: Diagnosis not present

## 2018-09-07 ENCOUNTER — Telehealth: Payer: Self-pay | Admitting: Cardiovascular Disease

## 2018-09-07 NOTE — Telephone Encounter (Signed)
The dental office will fax the letter for Dr Angelena Form to sign and acknowledge on Thursday 8/26.

## 2018-09-07 NOTE — Telephone Encounter (Signed)
  [facility] [facility] is following up regarding a letter faxed over on 09/06/18 that Dr Angelena Form needed to sign stating that he did tell the patient to stop his Eliquis before his dental appt which is on 09/08/18. She states that a verbal acknowledgement can be accepted.

## 2018-09-07 NOTE — Telephone Encounter (Signed)
He can stop his Eliquis prior to his dental appointment. I can sign the letter or request on Thursday when I am back in the office. Darlina Guys

## 2018-09-09 NOTE — Telephone Encounter (Signed)
I spoke with George [name] and told her we had not received fax.  George [name] reports pt had appointment today and they do not need anything signed at this time.

## 2018-11-05 DIAGNOSIS — D539 Nutritional anemia, unspecified: Secondary | ICD-10-CM | POA: Diagnosis not present

## 2018-11-05 DIAGNOSIS — Z6830 Body mass index (BMI) 30.0-30.9, adult: Secondary | ICD-10-CM | POA: Diagnosis not present

## 2018-11-05 DIAGNOSIS — Z23 Encounter for immunization: Secondary | ICD-10-CM | POA: Diagnosis not present

## 2018-11-05 DIAGNOSIS — J449 Chronic obstructive pulmonary disease, unspecified: Secondary | ICD-10-CM | POA: Diagnosis not present

## 2018-11-05 DIAGNOSIS — I480 Paroxysmal atrial fibrillation: Secondary | ICD-10-CM | POA: Diagnosis not present

## 2018-11-05 DIAGNOSIS — I10 Essential (primary) hypertension: Secondary | ICD-10-CM | POA: Diagnosis not present

## 2018-11-05 DIAGNOSIS — M47816 Spondylosis without myelopathy or radiculopathy, lumbar region: Secondary | ICD-10-CM | POA: Diagnosis not present

## 2018-11-24 IMAGING — DX DG CHEST 2V
2 series · 2 of 2 positions shown · non-contrast
Comparison: None.

CLINICAL DATA: Dry cough.

EXAM:
CHEST  2 VIEW

[chest pa]
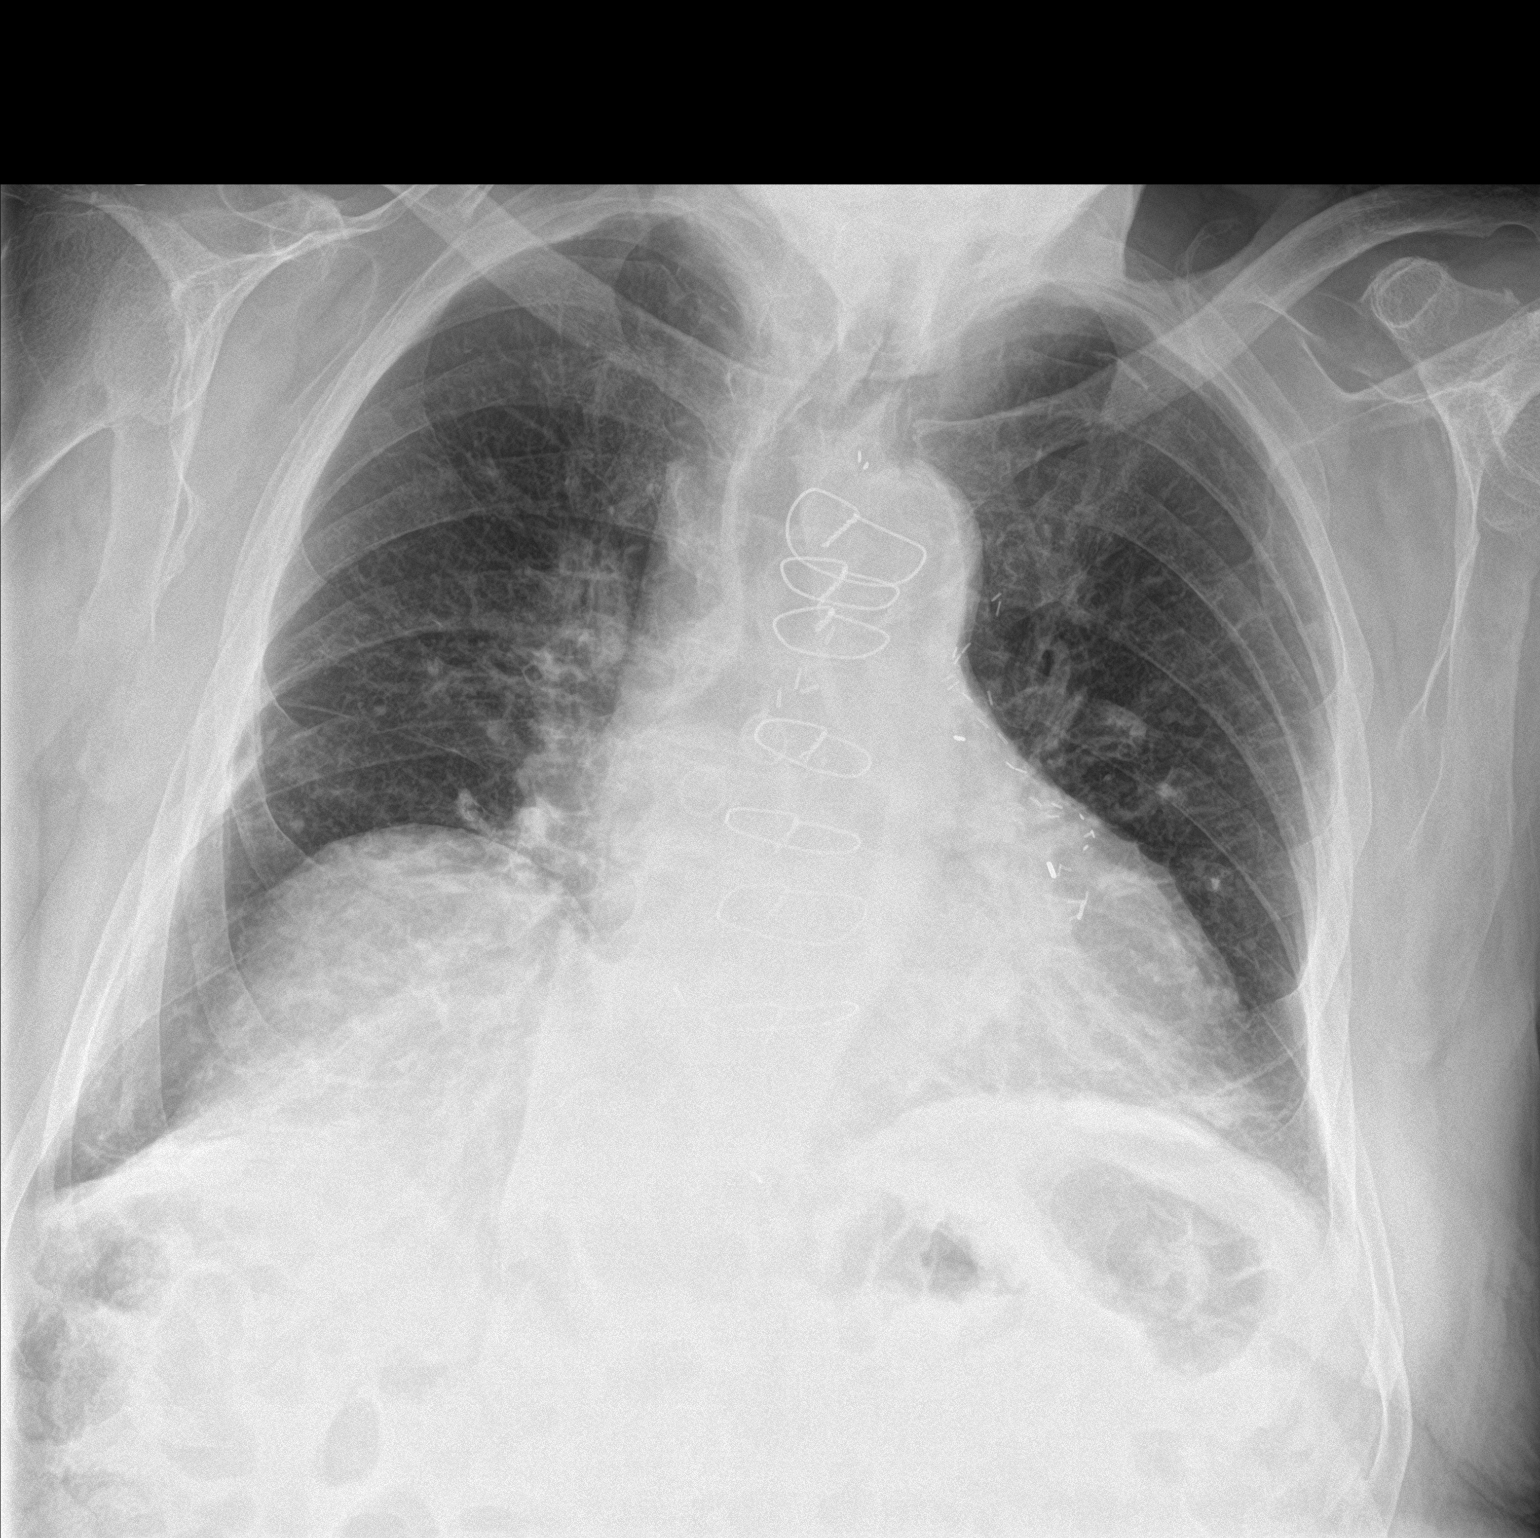

[chest lat]
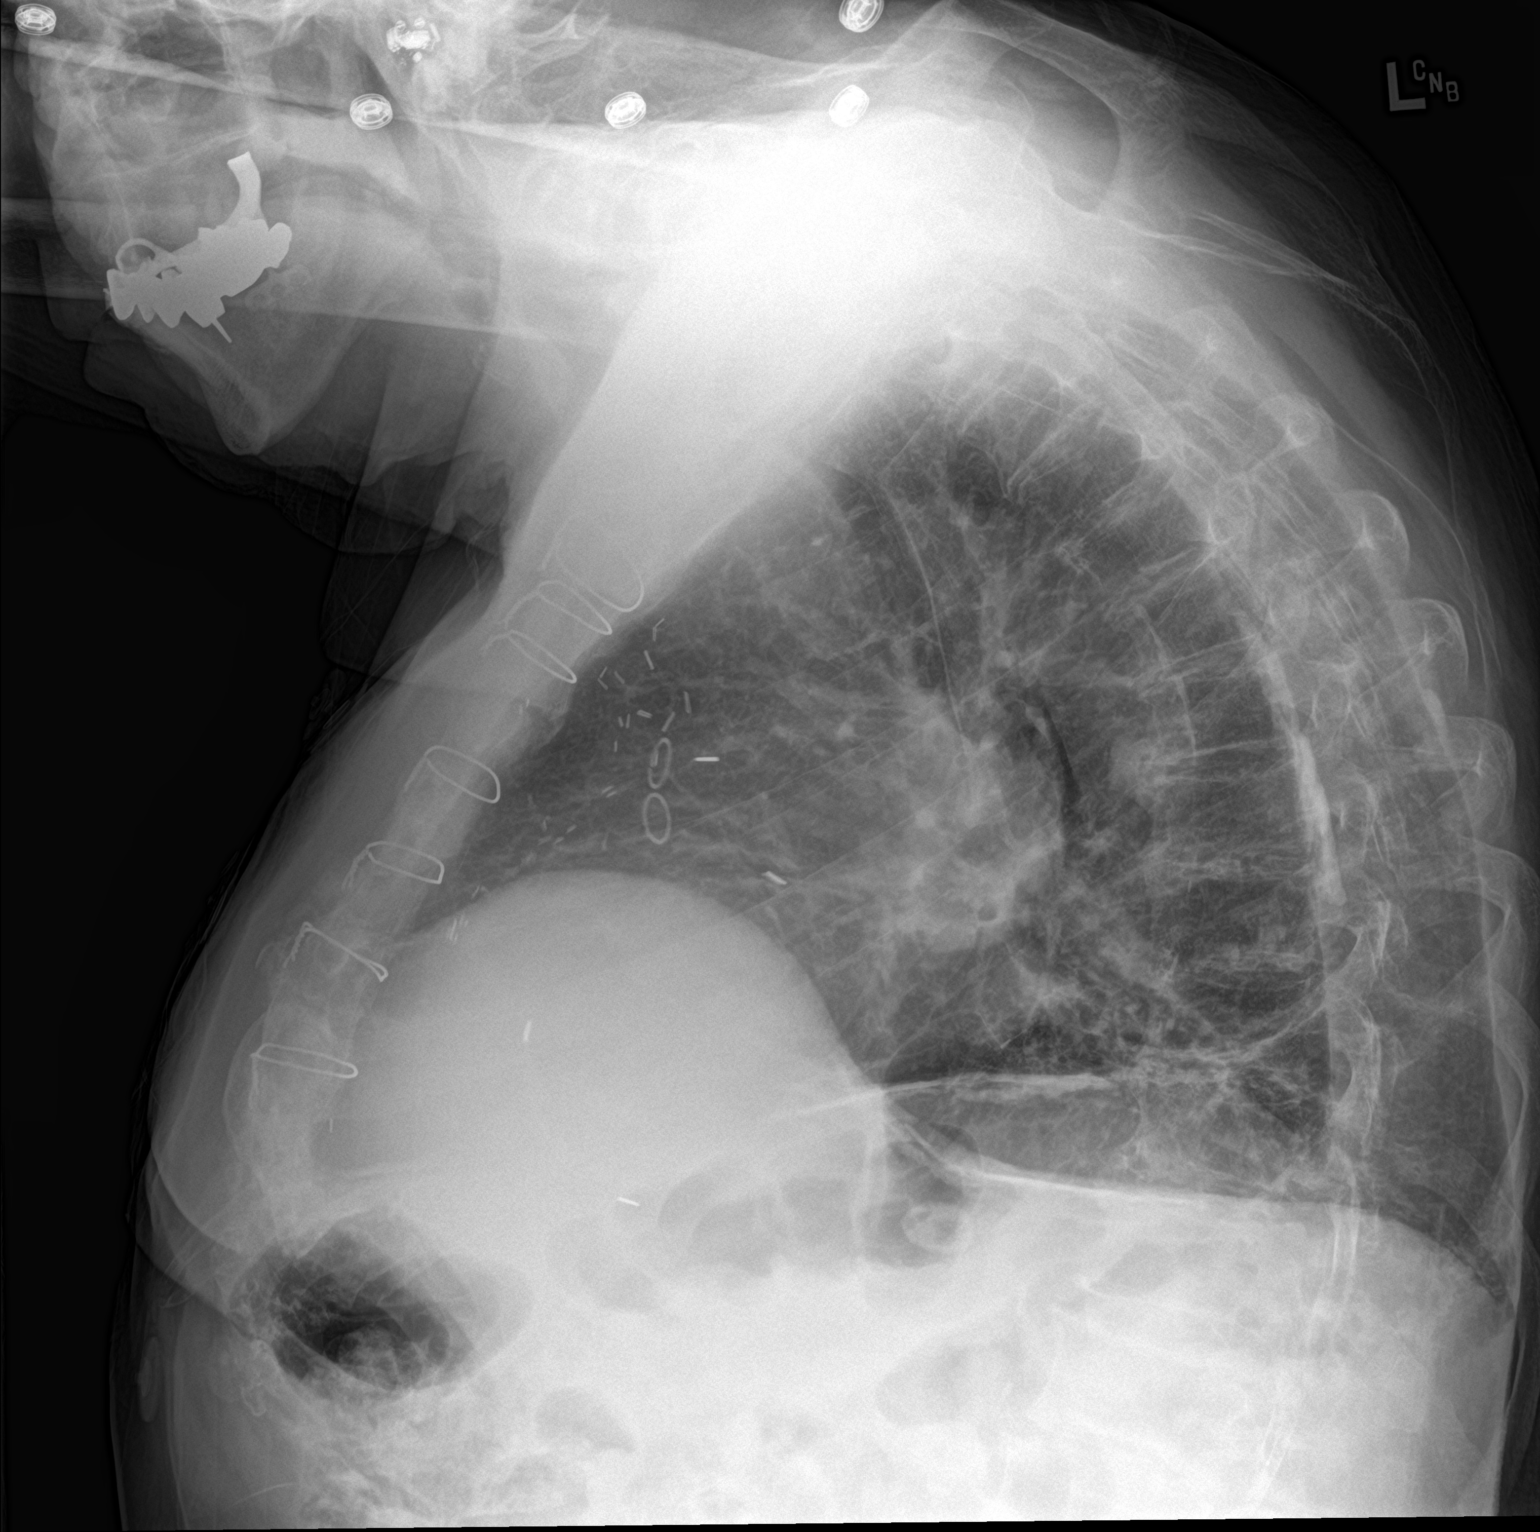

[2 of 2 positions shown; findings below may reference images not displayed]

FINDINGS: Elevation of the right hemidiaphragm is identified. Mild opacity in
the bases is favored to represent atelectasis rather than early
infiltrate. No other interval changes or acute abnormalities.
IMPRESSION: Mild opacity in the lung bases is favored to represent atelectasis
rather than infiltrate. No acute abnormalities identified.

## 2018-12-15 DIAGNOSIS — R351 Nocturia: Secondary | ICD-10-CM | POA: Diagnosis not present

## 2018-12-15 DIAGNOSIS — C61 Malignant neoplasm of prostate: Secondary | ICD-10-CM | POA: Diagnosis not present

## 2019-02-07 DIAGNOSIS — J449 Chronic obstructive pulmonary disease, unspecified: Secondary | ICD-10-CM | POA: Diagnosis not present

## 2019-02-07 DIAGNOSIS — I10 Essential (primary) hypertension: Secondary | ICD-10-CM | POA: Diagnosis not present

## 2019-02-07 DIAGNOSIS — I480 Paroxysmal atrial fibrillation: Secondary | ICD-10-CM | POA: Diagnosis not present

## 2019-02-07 DIAGNOSIS — D539 Nutritional anemia, unspecified: Secondary | ICD-10-CM | POA: Diagnosis not present

## 2019-02-07 DIAGNOSIS — Z6830 Body mass index (BMI) 30.0-30.9, adult: Secondary | ICD-10-CM | POA: Diagnosis not present

## 2019-02-07 DIAGNOSIS — M47816 Spondylosis without myelopathy or radiculopathy, lumbar region: Secondary | ICD-10-CM | POA: Diagnosis not present

## 2019-02-22 DIAGNOSIS — Z6831 Body mass index (BMI) 31.0-31.9, adult: Secondary | ICD-10-CM | POA: Diagnosis not present

## 2019-02-22 DIAGNOSIS — N390 Urinary tract infection, site not specified: Secondary | ICD-10-CM | POA: Diagnosis not present

## 2019-03-15 DIAGNOSIS — G8929 Other chronic pain: Secondary | ICD-10-CM | POA: Diagnosis not present

## 2019-03-15 DIAGNOSIS — M545 Low back pain: Secondary | ICD-10-CM | POA: Diagnosis not present

## 2019-03-15 DIAGNOSIS — Z6831 Body mass index (BMI) 31.0-31.9, adult: Secondary | ICD-10-CM | POA: Diagnosis not present

## 2019-03-15 DIAGNOSIS — M47816 Spondylosis without myelopathy or radiculopathy, lumbar region: Secondary | ICD-10-CM | POA: Diagnosis not present

## 2019-03-15 DIAGNOSIS — R3129 Other microscopic hematuria: Secondary | ICD-10-CM | POA: Diagnosis not present

## 2019-03-22 ENCOUNTER — Other Ambulatory Visit: Payer: Self-pay | Admitting: *Deleted

## 2019-03-22 MED ORDER — RIVAROXABAN 15 MG PO TABS: ORAL_TABLET | ORAL | 5 refills | Status: DC

## 2019-03-22 NOTE — Telephone Encounter (Signed)
Prescription refill request for Xarelto received.   Last office visit: Mcalhany, 08/19/2018 Weight: 86.5 kg Age: 86 y.o. Scr: 1.29, 07/05/2018 CrCl: 31 ml/min   Prescription refill sent.

## 2019-03-31 DIAGNOSIS — Z6831 Body mass index (BMI) 31.0-31.9, adult: Secondary | ICD-10-CM | POA: Diagnosis not present

## 2019-03-31 DIAGNOSIS — N390 Urinary tract infection, site not specified: Secondary | ICD-10-CM | POA: Diagnosis not present

## 2019-03-31 DIAGNOSIS — G8929 Other chronic pain: Secondary | ICD-10-CM | POA: Diagnosis not present

## 2019-03-31 DIAGNOSIS — I10 Essential (primary) hypertension: Secondary | ICD-10-CM | POA: Diagnosis not present

## 2019-03-31 DIAGNOSIS — M545 Low back pain: Secondary | ICD-10-CM | POA: Diagnosis not present

## 2019-03-31 DIAGNOSIS — R21 Rash and other nonspecific skin eruption: Secondary | ICD-10-CM | POA: Diagnosis not present

## 2019-03-31 DIAGNOSIS — I480 Paroxysmal atrial fibrillation: Secondary | ICD-10-CM | POA: Diagnosis not present

## 2019-03-31 DIAGNOSIS — J449 Chronic obstructive pulmonary disease, unspecified: Secondary | ICD-10-CM | POA: Diagnosis not present

## 2019-04-11 DIAGNOSIS — M549 Dorsalgia, unspecified: Secondary | ICD-10-CM | POA: Diagnosis not present

## 2019-04-11 DIAGNOSIS — C61 Malignant neoplasm of prostate: Secondary | ICD-10-CM | POA: Diagnosis not present

## 2019-04-19 DIAGNOSIS — M4856XA Collapsed vertebra, not elsewhere classified, lumbar region, initial encounter for fracture: Secondary | ICD-10-CM | POA: Diagnosis not present

## 2019-04-19 DIAGNOSIS — C61 Malignant neoplasm of prostate: Secondary | ICD-10-CM | POA: Diagnosis not present

## 2019-04-19 DIAGNOSIS — M8588 Other specified disorders of bone density and structure, other site: Secondary | ICD-10-CM | POA: Diagnosis not present

## 2019-04-19 DIAGNOSIS — I700 Atherosclerosis of aorta: Secondary | ICD-10-CM | POA: Diagnosis not present

## 2019-04-19 DIAGNOSIS — R109 Unspecified abdominal pain: Secondary | ICD-10-CM | POA: Diagnosis not present

## 2019-04-19 DIAGNOSIS — M4726 Other spondylosis with radiculopathy, lumbar region: Secondary | ICD-10-CM | POA: Diagnosis not present

## 2019-04-19 DIAGNOSIS — M545 Low back pain: Secondary | ICD-10-CM | POA: Diagnosis not present

## 2019-04-19 DIAGNOSIS — N200 Calculus of kidney: Secondary | ICD-10-CM | POA: Diagnosis not present

## 2019-04-19 DIAGNOSIS — I2510 Atherosclerotic heart disease of native coronary artery without angina pectoris: Secondary | ICD-10-CM | POA: Diagnosis not present

## 2019-05-09 DIAGNOSIS — M47816 Spondylosis without myelopathy or radiculopathy, lumbar region: Secondary | ICD-10-CM | POA: Diagnosis not present

## 2019-05-09 DIAGNOSIS — D539 Nutritional anemia, unspecified: Secondary | ICD-10-CM | POA: Diagnosis not present

## 2019-05-09 DIAGNOSIS — J449 Chronic obstructive pulmonary disease, unspecified: Secondary | ICD-10-CM | POA: Diagnosis not present

## 2019-05-09 DIAGNOSIS — I480 Paroxysmal atrial fibrillation: Secondary | ICD-10-CM | POA: Diagnosis not present

## 2019-05-09 DIAGNOSIS — Z6829 Body mass index (BMI) 29.0-29.9, adult: Secondary | ICD-10-CM | POA: Diagnosis not present

## 2019-05-09 DIAGNOSIS — I10 Essential (primary) hypertension: Secondary | ICD-10-CM | POA: Diagnosis not present

## 2019-05-09 DIAGNOSIS — L8992 Pressure ulcer of unspecified site, stage 2: Secondary | ICD-10-CM | POA: Diagnosis not present

## 2019-05-11 ENCOUNTER — Encounter: Payer: Self-pay | Admitting: Cardiovascular Disease

## 2019-05-11 ENCOUNTER — Ambulatory Visit: Payer: Medicare HMO | Admitting: Cardiovascular Disease

## 2019-05-11 ENCOUNTER — Other Ambulatory Visit: Payer: Self-pay

## 2019-05-11 ENCOUNTER — Encounter (INDEPENDENT_AMBULATORY_CARE_PROVIDER_SITE_OTHER): Payer: Self-pay

## 2019-05-11 VITALS — BP 108/60 | HR 72 | Ht 66.0 in | Wt 183.0 lb

## 2019-05-11 DIAGNOSIS — I130 Hypertensive heart and chronic kidney disease with heart failure and stage 1 through stage 4 chronic kidney disease, or unspecified chronic kidney disease: Secondary | ICD-10-CM | POA: Diagnosis not present

## 2019-05-11 DIAGNOSIS — I82502 Chronic embolism and thrombosis of unspecified deep veins of left lower extremity: Secondary | ICD-10-CM | POA: Diagnosis not present

## 2019-05-11 DIAGNOSIS — E876 Hypokalemia: Secondary | ICD-10-CM | POA: Diagnosis not present

## 2019-05-11 DIAGNOSIS — L89322 Pressure ulcer of left buttock, stage 2: Secondary | ICD-10-CM | POA: Diagnosis not present

## 2019-05-11 DIAGNOSIS — I2510 Atherosclerotic heart disease of native coronary artery without angina pectoris: Secondary | ICD-10-CM | POA: Diagnosis not present

## 2019-05-11 DIAGNOSIS — I4819 Other persistent atrial fibrillation: Secondary | ICD-10-CM

## 2019-05-11 DIAGNOSIS — I509 Heart failure, unspecified: Secondary | ICD-10-CM | POA: Diagnosis not present

## 2019-05-11 DIAGNOSIS — I350 Nonrheumatic aortic (valve) stenosis: Secondary | ICD-10-CM

## 2019-05-11 DIAGNOSIS — E538 Deficiency of other specified B group vitamins: Secondary | ICD-10-CM | POA: Diagnosis not present

## 2019-05-11 DIAGNOSIS — R609 Edema, unspecified: Secondary | ICD-10-CM | POA: Diagnosis not present

## 2019-05-11 DIAGNOSIS — I10 Essential (primary) hypertension: Secondary | ICD-10-CM | POA: Diagnosis not present

## 2019-05-11 DIAGNOSIS — I5032 Chronic diastolic (congestive) heart failure: Secondary | ICD-10-CM

## 2019-05-11 DIAGNOSIS — I480 Paroxysmal atrial fibrillation: Secondary | ICD-10-CM | POA: Diagnosis not present

## 2019-05-11 DIAGNOSIS — M47816 Spondylosis without myelopathy or radiculopathy, lumbar region: Secondary | ICD-10-CM | POA: Diagnosis not present

## 2019-05-11 DIAGNOSIS — N189 Chronic kidney disease, unspecified: Secondary | ICD-10-CM | POA: Diagnosis not present

## 2019-05-11 DIAGNOSIS — S322XXD Fracture of coccyx, subsequent encounter for fracture with routine healing: Secondary | ICD-10-CM | POA: Diagnosis not present

## 2019-05-11 DIAGNOSIS — J449 Chronic obstructive pulmonary disease, unspecified: Secondary | ICD-10-CM | POA: Diagnosis not present

## 2019-05-11 NOTE — Patient Instructions (Signed)
Medication Instructions:  ?No changes ?*If you need a refill on your cardiac medications before your next appointment, please call your pharmacy* ? ? ?Lab Work: ?none ?If you have labs (blood work) drawn today and your tests are completely normal, you will receive your results only by: ?MyChart Message (if you have MyChart) OR ?A paper copy in the mail ?If you have any lab test that is abnormal or we need to change your treatment, we will call you to review the results. ? ? ?Testing/Procedures: ?none ? ? ?Follow-Up: ?At [facility], you and your health needs are our priority.  As part of our continuing mission to provide you with exceptional heart care, we have created designated Provider Care Teams.  These Care Teams include your primary Cardiologist (physician) and Advanced Practice Providers (APPs -  Physician Assistants and Nurse Practitioners) who all work together to provide you with the care you need, when you need it. ? ?We recommend signing up for the patient portal called "MyChart".  Sign up information is provided on this After Visit Summary.  MyChart is used to connect with patients for Virtual Visits (Telemedicine).  Patients are able to view lab/test results, encounter notes, upcoming appointments, etc.  Non-urgent messages can be sent to your provider as well.   ?To learn more about what you can do with MyChart, go to [email or web address].   ? ?Your next appointment:   ?12 month(s) ? ?The format for your next appointment:   ?In Person ? ?Provider:   ?Christopher McAlhany, MD   ? ? ?Other Instructions ?  ?

## 2019-05-11 NOTE — Progress Notes (Signed)
Chief Complaint  Patient presents with  . Follow-up    CAD    History of Present Illness: 86 yo male with history of persistent atrial fibrillation, CAD s/p 5V CABG 2000, hyperlipidemia and HTN here today for cardiac follow up. He had 5V CABG in 2000. He had a catheterization done in 2007 which showed patent vein grafts after having an abnormal Myoview scan. His last echocardiogram was in February 2014 and this showed normal LV function, mild AS. He was found to have atrial fibrillation in 2013 and was started on Xarelto. He has had mild lower extremity edema over the years controlled with daily Lasix.   He is here today for follow up. The patient denies any chest pain, dyspnea, palpitations, lower extremity edema, orthopnea, PND, dizziness, near syncope or syncope.    Primary Care Physician: Lowella Dandy, NP  Past Medical History:  Diagnosis Date  . Atrial fibrillation ([place])   . Coronary artery disease   . Degenerative joint disease   . Hyperlipidemia   . Hypertension     Past Surgical History:  Procedure Laterality Date  . Broken right leg    . CORONARY ARTERY BYPASS GRAFT  2000   with patent grafts in 2004.  Good left ventricular function  . HERNIA REPAIR     multiple  . PROSTATECTOMY      Current Outpatient Medications  Medication Sig Dispense Refill  . acetaminophen (TYLENOL) 325 MG tablet Take 650 mg by mouth every 6 (six) hours as needed (for pain).     Marland Kitchen alendronate (FOSAMAX) 70 MG tablet Take 70 mg by mouth every 7 (seven) days. Take with a full glass of water on an empty stomach.    . Ascorbic Acid (VITAMIN C) 1000 MG tablet Take 1,000 mg by mouth daily.    . calcium carbonate (OSCAL) 1500 (600 Ca) MG TABS tablet Take 600 mg of elemental calcium by mouth 2 (two) times daily with a meal.    . Cholecalciferol (VITAMIN D-3) 1000 UNITS CAPS Take 1,000 Units by mouth daily.     . cyanocobalamin 1000 MCG tablet Take 1,000 mcg by mouth daily.     Marland Kitchen  dextromethorphan-guaiFENesin (MUCINEX DM) 30-600 MG 12hr tablet Take 1 tablet by mouth 2 (two) times daily. 15 tablet 0  . ferrous sulfate 325 (65 FE) MG tablet Take 325 mg by mouth daily with breakfast.    . finasteride (PROSCAR) 5 MG tablet Take 5 mg by mouth daily.    . furosemide (LASIX) 20 MG tablet Take 20 mg by mouth as needed (for swelling).     . Garlic Oil 123XX123 MG CAPS Take 1 capsule by mouth daily.     . Ipratropium-Albuterol (COMBIVENT RESPIMAT) 20-100 MCG/ACT AERS respimat Inhale 1 puff into the lungs every 6 (six) hours as needed (for wheezing).     . meclizine (ANTIVERT) 25 MG tablet Take 25 mg by mouth daily as needed for dizziness.    . metoprolol tartrate (LOPRESSOR) 25 MG tablet Take 12.5 mg by mouth 2 (two) times daily. Take 1/2 tab twice a day    . Omega-3 Fatty Acids (FISH OIL PO) Take 1 tablet by mouth daily.    Marland Kitchen omeprazole (PRILOSEC) 20 MG capsule Take 20 mg by mouth daily.      . potassium chloride SA (K-DUR,KLOR-CON) 20 MEQ tablet Take 20 mEq by mouth daily.    . Rivaroxaban (XARELTO) 15 MG TABS tablet TAKE 1 TABLET ONCE DAILY WITH LARGEST MEAL OF  DAY. 30 tablet 5  . simvastatin (ZOCOR) 80 MG tablet Take 40 mg by mouth at bedtime.      . tamsulosin (FLOMAX) 0.4 MG CAPS capsule Take 0.2 mg by mouth daily after supper.     . vitamin E 400 UNIT capsule Take 400 Units by mouth daily.      . nitroGLYCERIN (NITROSTAT) 0.4 MG SL tablet Place 1 tablet (0.4 mg total) under the tongue every 5 (five) minutes as needed for chest pain. 25 tablet 6   No current facility-administered medications for this visit.    Allergies  Allergen Reactions  . Codeine Other (See Comments)    Not known     Social History   Socioeconomic History  . Marital status: Widowed    Spouse name: Not on file  . Number of children: 4  . Years of education: Not on file  . Highest education level: Not on file  Occupational History  . Occupation: Regulatory affairs officer: RETIRED    Tobacco Use  . Smoking status: Former Smoker    Quit date: 09/13/1949    Years since quitting: 69.7  . Smokeless tobacco: Never Used  Substance and Sexual Activity  . Alcohol use: No  . Drug use: No  . Sexual activity: Not on file  Other Topics Concern  . Not on file  Social History Narrative  . Not on file   Social Determinants of Health   Financial Resource Strain:   . Difficulty of Paying Living Expenses:   Food Insecurity:   . Worried About Charity fundraiser in the Last Year:   . Arboriculturist in the Last Year:   Transportation Needs:   . Film/video editor (Medical):   Marland Kitchen Lack of Transportation (Non-Medical):   Physical Activity:   . Days of Exercise per Week:   . Minutes of Exercise per Session:   Stress:   . Feeling of Stress :   Social Connections:   . Frequency of Communication with Friends and Family:   . Frequency of Social Gatherings with Friends and Family:   . Attends Religious Services:   . Active Member of Clubs or Organizations:   . Attends Archivist Meetings:   Marland Kitchen Marital Status:   Intimate Partner Violence:   . Fear of Current or Ex-Partner:   . Emotionally Abused:   Marland Kitchen Physically Abused:   . Sexually Abused:     Family History  Problem Relation Age of Onset  . Heart failure Mother        Age 3  . Stroke Father        Age 92    Review of Systems:  As stated in the HPI and otherwise negative.   BP 108/60   Pulse 72   Ht 5\' 6"  (1.676 m)   Wt 183 lb (83 kg)   SpO2 98%   BMI 29.54 kg/m   Physical Examination:  General: Well developed, well nourished, NAD  HEENT: OP clear, mucus membranes moist  SKIN: warm, dry. No rashes. Neuro: No focal deficits  Musculoskeletal: Muscle strength 5/5 all ext  Psychiatric: Mood and affect normal  Neck: No JVD, no carotid bruits, no thyromegaly, no lymphadenopathy.  Lungs:Clear bilaterally, no wheezes, rhonci, crackles Cardiovascular: Regular rate and rhythm. Systolic murmur on exam.   Abdomen:Soft. Bowel sounds present. Non-tender.  Extremities: No lower extremity edema. Pulses are 2 + in the bilateral DP/PT.  Echo 02/16/12: Left ventricle: The cavity  size was normal. Wall thickness was increased in a pattern of mild LVH. There was focal basal hypertrophy. Systolic function was normal. The estimated ejection fraction was in the range of 60% to 65%. - Aortic valve: There was mild stenosis. - Left atrium: The atrium was moderately dilated. - Right atrium: The atrium was mildly dilated.  EKG:  EKG is not ordered today. The ekg ordered today demonstrates   Recent Labs: No results found for requested labs within last 8760 hours.   Lipid Panel No results found for: CHOL, TRIG, HDL, CHOLHDL, VLDL, LDLCALC, LDLDIRECT   Wt Readings from Last 3 Encounters:  05/11/19 183 lb (83 kg)  08/19/18 190 lb 12.8 oz (86.5 kg)  10/29/17 188 lb (85.3 kg)     Other studies Reviewed: Additional studies/ records that were reviewed today include: . Review of the above records demonstrates:    Assessment and Plan:   1. CAD s/p CABG without angina: He has no chest pain. He is not on ASA given advanced age and since he is also on Xarelto. No plans for ischemic testing given his advanced age. Continue beta blocker and statin  2. Atrial fibrillation, persistent: Rate controlled atrial fib today. Continue beta blocker and Xarelto.      3. Chronic diastolic CHF: No volume overload on exam. Weight is stable. Continue Lasix.   4. HTN: BP is controlled. Continue current therapy  5. Aortic stenosis: Echo in 2014 with mild AS. Systolic murmur on exam. No plans for repeat echo given advanced age and limited options for treatment because of his age.  Current medicines are reviewed at length with the patient today.  The patient does not have concerns regarding medicines.  The following changes have been made:  no change  Labs/ tests ordered today include:   No orders of the defined types  were placed in this encounter.   Disposition:   FU with me in 6 months  Signed, Lauree Chandler, MD 05/11/2019 3:03 PM    [facility] [place], [place], [place]  [address] Phone: [phone]; Fax: [phone]

## 2019-05-13 DIAGNOSIS — M47816 Spondylosis without myelopathy or radiculopathy, lumbar region: Secondary | ICD-10-CM | POA: Diagnosis not present

## 2019-05-13 DIAGNOSIS — I82502 Chronic embolism and thrombosis of unspecified deep veins of left lower extremity: Secondary | ICD-10-CM | POA: Diagnosis not present

## 2019-05-13 DIAGNOSIS — I2510 Atherosclerotic heart disease of native coronary artery without angina pectoris: Secondary | ICD-10-CM | POA: Diagnosis not present

## 2019-05-13 DIAGNOSIS — L89322 Pressure ulcer of left buttock, stage 2: Secondary | ICD-10-CM | POA: Diagnosis not present

## 2019-05-13 DIAGNOSIS — J449 Chronic obstructive pulmonary disease, unspecified: Secondary | ICD-10-CM | POA: Diagnosis not present

## 2019-05-13 DIAGNOSIS — I130 Hypertensive heart and chronic kidney disease with heart failure and stage 1 through stage 4 chronic kidney disease, or unspecified chronic kidney disease: Secondary | ICD-10-CM | POA: Diagnosis not present

## 2019-05-13 DIAGNOSIS — I480 Paroxysmal atrial fibrillation: Secondary | ICD-10-CM | POA: Diagnosis not present

## 2019-05-13 DIAGNOSIS — N189 Chronic kidney disease, unspecified: Secondary | ICD-10-CM | POA: Diagnosis not present

## 2019-05-13 DIAGNOSIS — I509 Heart failure, unspecified: Secondary | ICD-10-CM | POA: Diagnosis not present

## 2019-05-17 DIAGNOSIS — M47816 Spondylosis without myelopathy or radiculopathy, lumbar region: Secondary | ICD-10-CM | POA: Diagnosis not present

## 2019-05-17 DIAGNOSIS — N189 Chronic kidney disease, unspecified: Secondary | ICD-10-CM | POA: Diagnosis not present

## 2019-05-17 DIAGNOSIS — I82502 Chronic embolism and thrombosis of unspecified deep veins of left lower extremity: Secondary | ICD-10-CM | POA: Diagnosis not present

## 2019-05-17 DIAGNOSIS — I509 Heart failure, unspecified: Secondary | ICD-10-CM | POA: Diagnosis not present

## 2019-05-17 DIAGNOSIS — I130 Hypertensive heart and chronic kidney disease with heart failure and stage 1 through stage 4 chronic kidney disease, or unspecified chronic kidney disease: Secondary | ICD-10-CM | POA: Diagnosis not present

## 2019-05-17 DIAGNOSIS — I480 Paroxysmal atrial fibrillation: Secondary | ICD-10-CM | POA: Diagnosis not present

## 2019-05-17 DIAGNOSIS — L89322 Pressure ulcer of left buttock, stage 2: Secondary | ICD-10-CM | POA: Diagnosis not present

## 2019-05-17 DIAGNOSIS — J449 Chronic obstructive pulmonary disease, unspecified: Secondary | ICD-10-CM | POA: Diagnosis not present

## 2019-05-17 DIAGNOSIS — I2510 Atherosclerotic heart disease of native coronary artery without angina pectoris: Secondary | ICD-10-CM | POA: Diagnosis not present

## 2019-05-19 ENCOUNTER — Other Ambulatory Visit: Payer: Self-pay | Admitting: Cardiovascular Disease

## 2019-05-19 MED ORDER — METOPROLOL TARTRATE 25 MG PO TABS: 12.5000 mg | ORAL_TABLET | Freq: Two times a day (BID) | ORAL | 3 refills | Status: AC

## 2019-05-19 MED ORDER — RIVAROXABAN 15 MG PO TABS: ORAL_TABLET | ORAL | 1 refills | Status: DC

## 2019-05-19 NOTE — Telephone Encounter (Signed)
*  STAT* If patient is at the pharmacy, call can be transferred to refill team.   1. Which medications need to be refilled? (please list name of each medication and dose if known)  metoprolol tartrate (LOPRESSOR) 25 MG tablet  Rivaroxaban (XARELTO) 15 MG TABS tablet  2. Which pharmacy/location (including street and city if local pharmacy) is medication to be sent to? [place], [place]  3. Do they need a 30 day or 90 day supply? 37  Daughter of the patient called. The patient will need to have these medications sent to [facility] for mail order instead of going to pick it up from the local pharmacy .  Daughter said that [facility] would contact the [place] office but if we would need to contact them to use the Doctor  #  [phone]

## 2019-05-19 NOTE — Telephone Encounter (Signed)
Prescription refill request for Xarelto received.   Last office visit: Mcalhany, 05/11/2019 Weight: 83 kg Age: 86 y.o. Scr: 1.29, 07/05/2018 CrCl: 35.75 ml/min   Prescription refill sent.

## 2019-05-20 DIAGNOSIS — M47816 Spondylosis without myelopathy or radiculopathy, lumbar region: Secondary | ICD-10-CM | POA: Diagnosis not present

## 2019-05-20 DIAGNOSIS — L89322 Pressure ulcer of left buttock, stage 2: Secondary | ICD-10-CM | POA: Diagnosis not present

## 2019-05-20 DIAGNOSIS — I480 Paroxysmal atrial fibrillation: Secondary | ICD-10-CM | POA: Diagnosis not present

## 2019-05-20 DIAGNOSIS — I82502 Chronic embolism and thrombosis of unspecified deep veins of left lower extremity: Secondary | ICD-10-CM | POA: Diagnosis not present

## 2019-05-20 DIAGNOSIS — I509 Heart failure, unspecified: Secondary | ICD-10-CM | POA: Diagnosis not present

## 2019-05-20 DIAGNOSIS — I130 Hypertensive heart and chronic kidney disease with heart failure and stage 1 through stage 4 chronic kidney disease, or unspecified chronic kidney disease: Secondary | ICD-10-CM | POA: Diagnosis not present

## 2019-05-20 DIAGNOSIS — I2510 Atherosclerotic heart disease of native coronary artery without angina pectoris: Secondary | ICD-10-CM | POA: Diagnosis not present

## 2019-05-20 DIAGNOSIS — J449 Chronic obstructive pulmonary disease, unspecified: Secondary | ICD-10-CM | POA: Diagnosis not present

## 2019-05-20 DIAGNOSIS — N189 Chronic kidney disease, unspecified: Secondary | ICD-10-CM | POA: Diagnosis not present

## 2019-05-23 ENCOUNTER — Other Ambulatory Visit: Payer: Self-pay | Admitting: *Deleted

## 2019-05-23 DIAGNOSIS — E785 Hyperlipidemia, unspecified: Secondary | ICD-10-CM | POA: Diagnosis not present

## 2019-05-23 DIAGNOSIS — Z9181 History of falling: Secondary | ICD-10-CM | POA: Diagnosis not present

## 2019-05-23 DIAGNOSIS — Z0000 Encounter for general adult medical examination without abnormal findings: Secondary | ICD-10-CM | POA: Diagnosis not present

## 2019-05-23 DIAGNOSIS — Z1331 Encounter for screening for depression: Secondary | ICD-10-CM | POA: Diagnosis not present

## 2019-05-23 MED ORDER — RIVAROXABAN 15 MG PO TABS: ORAL_TABLET | ORAL | 2 refills | Status: AC

## 2019-05-23 NOTE — Telephone Encounter (Signed)
Xarelto 15mg  paper refill request received from [facility]. Pt is 86 years old, weight-83kg, Crea-1.07 on 03/31/2019 via [facility] from [facility], last seen by Dr. Angelena Form on 05/11/2019, Diagnosis-Afib, [place].35ml/min; Dose is appropriate based on dosing criteria. Will send in refill to requested pharmacy.

## 2019-05-24 DIAGNOSIS — L89322 Pressure ulcer of left buttock, stage 2: Secondary | ICD-10-CM | POA: Diagnosis not present

## 2019-05-24 DIAGNOSIS — I2510 Atherosclerotic heart disease of native coronary artery without angina pectoris: Secondary | ICD-10-CM | POA: Diagnosis not present

## 2019-05-24 DIAGNOSIS — I509 Heart failure, unspecified: Secondary | ICD-10-CM | POA: Diagnosis not present

## 2019-05-24 DIAGNOSIS — N189 Chronic kidney disease, unspecified: Secondary | ICD-10-CM | POA: Diagnosis not present

## 2019-05-24 DIAGNOSIS — I82502 Chronic embolism and thrombosis of unspecified deep veins of left lower extremity: Secondary | ICD-10-CM | POA: Diagnosis not present

## 2019-05-24 DIAGNOSIS — I480 Paroxysmal atrial fibrillation: Secondary | ICD-10-CM | POA: Diagnosis not present

## 2019-05-24 DIAGNOSIS — I130 Hypertensive heart and chronic kidney disease with heart failure and stage 1 through stage 4 chronic kidney disease, or unspecified chronic kidney disease: Secondary | ICD-10-CM | POA: Diagnosis not present

## 2019-05-24 DIAGNOSIS — M47816 Spondylosis without myelopathy or radiculopathy, lumbar region: Secondary | ICD-10-CM | POA: Diagnosis not present

## 2019-05-24 DIAGNOSIS — J449 Chronic obstructive pulmonary disease, unspecified: Secondary | ICD-10-CM | POA: Diagnosis not present

## 2019-05-27 DIAGNOSIS — I509 Heart failure, unspecified: Secondary | ICD-10-CM | POA: Diagnosis not present

## 2019-05-27 DIAGNOSIS — I480 Paroxysmal atrial fibrillation: Secondary | ICD-10-CM | POA: Diagnosis not present

## 2019-05-27 DIAGNOSIS — J449 Chronic obstructive pulmonary disease, unspecified: Secondary | ICD-10-CM | POA: Diagnosis not present

## 2019-05-27 DIAGNOSIS — L89322 Pressure ulcer of left buttock, stage 2: Secondary | ICD-10-CM | POA: Diagnosis not present

## 2019-05-27 DIAGNOSIS — I2510 Atherosclerotic heart disease of native coronary artery without angina pectoris: Secondary | ICD-10-CM | POA: Diagnosis not present

## 2019-05-27 DIAGNOSIS — M47816 Spondylosis without myelopathy or radiculopathy, lumbar region: Secondary | ICD-10-CM | POA: Diagnosis not present

## 2019-05-27 DIAGNOSIS — I130 Hypertensive heart and chronic kidney disease with heart failure and stage 1 through stage 4 chronic kidney disease, or unspecified chronic kidney disease: Secondary | ICD-10-CM | POA: Diagnosis not present

## 2019-05-27 DIAGNOSIS — N189 Chronic kidney disease, unspecified: Secondary | ICD-10-CM | POA: Diagnosis not present

## 2019-05-27 DIAGNOSIS — I82502 Chronic embolism and thrombosis of unspecified deep veins of left lower extremity: Secondary | ICD-10-CM | POA: Diagnosis not present

## 2019-05-31 DIAGNOSIS — I480 Paroxysmal atrial fibrillation: Secondary | ICD-10-CM | POA: Diagnosis not present

## 2019-05-31 DIAGNOSIS — I2510 Atherosclerotic heart disease of native coronary artery without angina pectoris: Secondary | ICD-10-CM | POA: Diagnosis not present

## 2019-05-31 DIAGNOSIS — J449 Chronic obstructive pulmonary disease, unspecified: Secondary | ICD-10-CM | POA: Diagnosis not present

## 2019-05-31 DIAGNOSIS — L89322 Pressure ulcer of left buttock, stage 2: Secondary | ICD-10-CM | POA: Diagnosis not present

## 2019-05-31 DIAGNOSIS — I82502 Chronic embolism and thrombosis of unspecified deep veins of left lower extremity: Secondary | ICD-10-CM | POA: Diagnosis not present

## 2019-05-31 DIAGNOSIS — I509 Heart failure, unspecified: Secondary | ICD-10-CM | POA: Diagnosis not present

## 2019-05-31 DIAGNOSIS — N189 Chronic kidney disease, unspecified: Secondary | ICD-10-CM | POA: Diagnosis not present

## 2019-05-31 DIAGNOSIS — M47816 Spondylosis without myelopathy or radiculopathy, lumbar region: Secondary | ICD-10-CM | POA: Diagnosis not present

## 2019-05-31 DIAGNOSIS — I130 Hypertensive heart and chronic kidney disease with heart failure and stage 1 through stage 4 chronic kidney disease, or unspecified chronic kidney disease: Secondary | ICD-10-CM | POA: Diagnosis not present

## 2019-06-03 DIAGNOSIS — L89322 Pressure ulcer of left buttock, stage 2: Secondary | ICD-10-CM | POA: Diagnosis not present

## 2019-06-03 DIAGNOSIS — I480 Paroxysmal atrial fibrillation: Secondary | ICD-10-CM | POA: Diagnosis not present

## 2019-06-03 DIAGNOSIS — I509 Heart failure, unspecified: Secondary | ICD-10-CM | POA: Diagnosis not present

## 2019-06-03 DIAGNOSIS — J449 Chronic obstructive pulmonary disease, unspecified: Secondary | ICD-10-CM | POA: Diagnosis not present

## 2019-06-03 DIAGNOSIS — I2510 Atherosclerotic heart disease of native coronary artery without angina pectoris: Secondary | ICD-10-CM | POA: Diagnosis not present

## 2019-06-03 DIAGNOSIS — I82502 Chronic embolism and thrombosis of unspecified deep veins of left lower extremity: Secondary | ICD-10-CM | POA: Diagnosis not present

## 2019-06-03 DIAGNOSIS — N189 Chronic kidney disease, unspecified: Secondary | ICD-10-CM | POA: Diagnosis not present

## 2019-06-03 DIAGNOSIS — I130 Hypertensive heart and chronic kidney disease with heart failure and stage 1 through stage 4 chronic kidney disease, or unspecified chronic kidney disease: Secondary | ICD-10-CM | POA: Diagnosis not present

## 2019-06-03 DIAGNOSIS — M47816 Spondylosis without myelopathy or radiculopathy, lumbar region: Secondary | ICD-10-CM | POA: Diagnosis not present

## 2019-06-07 DIAGNOSIS — J449 Chronic obstructive pulmonary disease, unspecified: Secondary | ICD-10-CM | POA: Diagnosis not present

## 2019-06-07 DIAGNOSIS — M47816 Spondylosis without myelopathy or radiculopathy, lumbar region: Secondary | ICD-10-CM | POA: Diagnosis not present

## 2019-06-07 DIAGNOSIS — L89322 Pressure ulcer of left buttock, stage 2: Secondary | ICD-10-CM | POA: Diagnosis not present

## 2019-06-07 DIAGNOSIS — N189 Chronic kidney disease, unspecified: Secondary | ICD-10-CM | POA: Diagnosis not present

## 2019-06-07 DIAGNOSIS — I480 Paroxysmal atrial fibrillation: Secondary | ICD-10-CM | POA: Diagnosis not present

## 2019-06-07 DIAGNOSIS — I82502 Chronic embolism and thrombosis of unspecified deep veins of left lower extremity: Secondary | ICD-10-CM | POA: Diagnosis not present

## 2019-06-07 DIAGNOSIS — I2510 Atherosclerotic heart disease of native coronary artery without angina pectoris: Secondary | ICD-10-CM | POA: Diagnosis not present

## 2019-06-07 DIAGNOSIS — I130 Hypertensive heart and chronic kidney disease with heart failure and stage 1 through stage 4 chronic kidney disease, or unspecified chronic kidney disease: Secondary | ICD-10-CM | POA: Diagnosis not present

## 2019-06-07 DIAGNOSIS — I509 Heart failure, unspecified: Secondary | ICD-10-CM | POA: Diagnosis not present

## 2019-06-10 DIAGNOSIS — I2510 Atherosclerotic heart disease of native coronary artery without angina pectoris: Secondary | ICD-10-CM | POA: Diagnosis not present

## 2019-06-10 DIAGNOSIS — M47816 Spondylosis without myelopathy or radiculopathy, lumbar region: Secondary | ICD-10-CM | POA: Diagnosis not present

## 2019-06-10 DIAGNOSIS — I130 Hypertensive heart and chronic kidney disease with heart failure and stage 1 through stage 4 chronic kidney disease, or unspecified chronic kidney disease: Secondary | ICD-10-CM | POA: Diagnosis not present

## 2019-06-10 DIAGNOSIS — N189 Chronic kidney disease, unspecified: Secondary | ICD-10-CM | POA: Diagnosis not present

## 2019-06-10 DIAGNOSIS — I82502 Chronic embolism and thrombosis of unspecified deep veins of left lower extremity: Secondary | ICD-10-CM | POA: Diagnosis not present

## 2019-06-10 DIAGNOSIS — I480 Paroxysmal atrial fibrillation: Secondary | ICD-10-CM | POA: Diagnosis not present

## 2019-06-10 DIAGNOSIS — I509 Heart failure, unspecified: Secondary | ICD-10-CM | POA: Diagnosis not present

## 2019-06-10 DIAGNOSIS — J449 Chronic obstructive pulmonary disease, unspecified: Secondary | ICD-10-CM | POA: Diagnosis not present

## 2019-06-10 DIAGNOSIS — L89322 Pressure ulcer of left buttock, stage 2: Secondary | ICD-10-CM | POA: Diagnosis not present

## 2019-06-15 DIAGNOSIS — C61 Malignant neoplasm of prostate: Secondary | ICD-10-CM | POA: Diagnosis not present

## 2019-06-15 DIAGNOSIS — R351 Nocturia: Secondary | ICD-10-CM | POA: Diagnosis not present

## 2019-06-20 DIAGNOSIS — I10 Essential (primary) hypertension: Secondary | ICD-10-CM | POA: Diagnosis not present

## 2019-06-20 DIAGNOSIS — R634 Abnormal weight loss: Secondary | ICD-10-CM | POA: Diagnosis not present

## 2019-06-20 DIAGNOSIS — L8992 Pressure ulcer of unspecified site, stage 2: Secondary | ICD-10-CM | POA: Diagnosis not present

## 2019-06-20 DIAGNOSIS — M47816 Spondylosis without myelopathy or radiculopathy, lumbar region: Secondary | ICD-10-CM | POA: Diagnosis not present

## 2019-06-20 DIAGNOSIS — J449 Chronic obstructive pulmonary disease, unspecified: Secondary | ICD-10-CM | POA: Diagnosis not present

## 2019-06-20 DIAGNOSIS — Z6829 Body mass index (BMI) 29.0-29.9, adult: Secondary | ICD-10-CM | POA: Diagnosis not present

## 2019-07-15 DIAGNOSIS — R6889 Other general symptoms and signs: Secondary | ICD-10-CM | POA: Diagnosis not present

## 2019-07-15 DIAGNOSIS — R0989 Other specified symptoms and signs involving the circulatory and respiratory systems: Secondary | ICD-10-CM | POA: Diagnosis not present

## 2019-07-15 DIAGNOSIS — T17308A Unspecified foreign body in larynx causing other injury, initial encounter: Secondary | ICD-10-CM | POA: Diagnosis not present

## 2019-07-15 DIAGNOSIS — J342 Deviated nasal septum: Secondary | ICD-10-CM | POA: Diagnosis not present

## 2019-07-15 DIAGNOSIS — R1310 Dysphagia, unspecified: Secondary | ICD-10-CM | POA: Diagnosis not present

## 2019-08-22 DIAGNOSIS — J449 Chronic obstructive pulmonary disease, unspecified: Secondary | ICD-10-CM | POA: Diagnosis not present

## 2019-08-22 DIAGNOSIS — M19071 Primary osteoarthritis, right ankle and foot: Secondary | ICD-10-CM | POA: Diagnosis not present

## 2019-08-22 DIAGNOSIS — I10 Essential (primary) hypertension: Secondary | ICD-10-CM | POA: Diagnosis not present

## 2019-08-22 DIAGNOSIS — L03115 Cellulitis of right lower limb: Secondary | ICD-10-CM | POA: Diagnosis not present

## 2019-08-22 DIAGNOSIS — S91001A Unspecified open wound, right ankle, initial encounter: Secondary | ICD-10-CM | POA: Diagnosis not present

## 2019-08-22 DIAGNOSIS — M79671 Pain in right foot: Secondary | ICD-10-CM | POA: Diagnosis not present

## 2019-08-22 DIAGNOSIS — M7989 Other specified soft tissue disorders: Secondary | ICD-10-CM | POA: Diagnosis not present

## 2019-08-22 DIAGNOSIS — M47816 Spondylosis without myelopathy or radiculopathy, lumbar region: Secondary | ICD-10-CM | POA: Diagnosis not present

## 2019-08-22 DIAGNOSIS — Z6830 Body mass index (BMI) 30.0-30.9, adult: Secondary | ICD-10-CM | POA: Diagnosis not present

## 2019-08-22 DIAGNOSIS — R600 Localized edema: Secondary | ICD-10-CM | POA: Diagnosis not present

## 2019-08-22 DIAGNOSIS — M25571 Pain in right ankle and joints of right foot: Secondary | ICD-10-CM | POA: Diagnosis not present

## 2019-08-23 DIAGNOSIS — L03115 Cellulitis of right lower limb: Secondary | ICD-10-CM | POA: Diagnosis not present

## 2019-08-24 DIAGNOSIS — L03115 Cellulitis of right lower limb: Secondary | ICD-10-CM | POA: Diagnosis not present

## 2019-08-25 DIAGNOSIS — I509 Heart failure, unspecified: Secondary | ICD-10-CM | POA: Diagnosis not present

## 2019-08-25 DIAGNOSIS — J449 Chronic obstructive pulmonary disease, unspecified: Secondary | ICD-10-CM | POA: Diagnosis not present

## 2019-08-25 DIAGNOSIS — D631 Anemia in chronic kidney disease: Secondary | ICD-10-CM | POA: Diagnosis not present

## 2019-08-25 DIAGNOSIS — I82502 Chronic embolism and thrombosis of unspecified deep veins of left lower extremity: Secondary | ICD-10-CM | POA: Diagnosis not present

## 2019-08-25 DIAGNOSIS — I130 Hypertensive heart and chronic kidney disease with heart failure and stage 1 through stage 4 chronic kidney disease, or unspecified chronic kidney disease: Secondary | ICD-10-CM | POA: Diagnosis not present

## 2019-08-25 DIAGNOSIS — L03115 Cellulitis of right lower limb: Secondary | ICD-10-CM | POA: Diagnosis not present

## 2019-08-25 DIAGNOSIS — I2510 Atherosclerotic heart disease of native coronary artery without angina pectoris: Secondary | ICD-10-CM | POA: Diagnosis not present

## 2019-08-25 DIAGNOSIS — N189 Chronic kidney disease, unspecified: Secondary | ICD-10-CM | POA: Diagnosis not present

## 2019-08-25 DIAGNOSIS — I480 Paroxysmal atrial fibrillation: Secondary | ICD-10-CM | POA: Diagnosis not present

## 2019-08-29 DIAGNOSIS — I509 Heart failure, unspecified: Secondary | ICD-10-CM | POA: Diagnosis not present

## 2019-08-29 DIAGNOSIS — J449 Chronic obstructive pulmonary disease, unspecified: Secondary | ICD-10-CM | POA: Diagnosis not present

## 2019-08-29 DIAGNOSIS — I2510 Atherosclerotic heart disease of native coronary artery without angina pectoris: Secondary | ICD-10-CM | POA: Diagnosis not present

## 2019-08-29 DIAGNOSIS — N189 Chronic kidney disease, unspecified: Secondary | ICD-10-CM | POA: Diagnosis not present

## 2019-08-29 DIAGNOSIS — I82502 Chronic embolism and thrombosis of unspecified deep veins of left lower extremity: Secondary | ICD-10-CM | POA: Diagnosis not present

## 2019-08-29 DIAGNOSIS — D631 Anemia in chronic kidney disease: Secondary | ICD-10-CM | POA: Diagnosis not present

## 2019-08-29 DIAGNOSIS — I480 Paroxysmal atrial fibrillation: Secondary | ICD-10-CM | POA: Diagnosis not present

## 2019-08-29 DIAGNOSIS — L03115 Cellulitis of right lower limb: Secondary | ICD-10-CM | POA: Diagnosis not present

## 2019-08-29 DIAGNOSIS — I130 Hypertensive heart and chronic kidney disease with heart failure and stage 1 through stage 4 chronic kidney disease, or unspecified chronic kidney disease: Secondary | ICD-10-CM | POA: Diagnosis not present

## 2019-09-01 DIAGNOSIS — D631 Anemia in chronic kidney disease: Secondary | ICD-10-CM | POA: Diagnosis not present

## 2019-09-01 DIAGNOSIS — I2510 Atherosclerotic heart disease of native coronary artery without angina pectoris: Secondary | ICD-10-CM | POA: Diagnosis not present

## 2019-09-01 DIAGNOSIS — N189 Chronic kidney disease, unspecified: Secondary | ICD-10-CM | POA: Diagnosis not present

## 2019-09-01 DIAGNOSIS — J449 Chronic obstructive pulmonary disease, unspecified: Secondary | ICD-10-CM | POA: Diagnosis not present

## 2019-09-01 DIAGNOSIS — I130 Hypertensive heart and chronic kidney disease with heart failure and stage 1 through stage 4 chronic kidney disease, or unspecified chronic kidney disease: Secondary | ICD-10-CM | POA: Diagnosis not present

## 2019-09-01 DIAGNOSIS — I480 Paroxysmal atrial fibrillation: Secondary | ICD-10-CM | POA: Diagnosis not present

## 2019-09-01 DIAGNOSIS — I509 Heart failure, unspecified: Secondary | ICD-10-CM | POA: Diagnosis not present

## 2019-09-01 DIAGNOSIS — L03115 Cellulitis of right lower limb: Secondary | ICD-10-CM | POA: Diagnosis not present

## 2019-09-01 DIAGNOSIS — I82502 Chronic embolism and thrombosis of unspecified deep veins of left lower extremity: Secondary | ICD-10-CM | POA: Diagnosis not present

## 2019-09-05 DIAGNOSIS — I2510 Atherosclerotic heart disease of native coronary artery without angina pectoris: Secondary | ICD-10-CM | POA: Diagnosis not present

## 2019-09-05 DIAGNOSIS — I130 Hypertensive heart and chronic kidney disease with heart failure and stage 1 through stage 4 chronic kidney disease, or unspecified chronic kidney disease: Secondary | ICD-10-CM | POA: Diagnosis not present

## 2019-09-05 DIAGNOSIS — L03115 Cellulitis of right lower limb: Secondary | ICD-10-CM | POA: Diagnosis not present

## 2019-09-05 DIAGNOSIS — D631 Anemia in chronic kidney disease: Secondary | ICD-10-CM | POA: Diagnosis not present

## 2019-09-05 DIAGNOSIS — I509 Heart failure, unspecified: Secondary | ICD-10-CM | POA: Diagnosis not present

## 2019-09-05 DIAGNOSIS — J449 Chronic obstructive pulmonary disease, unspecified: Secondary | ICD-10-CM | POA: Diagnosis not present

## 2019-09-05 DIAGNOSIS — I480 Paroxysmal atrial fibrillation: Secondary | ICD-10-CM | POA: Diagnosis not present

## 2019-09-05 DIAGNOSIS — N189 Chronic kidney disease, unspecified: Secondary | ICD-10-CM | POA: Diagnosis not present

## 2019-09-05 DIAGNOSIS — I82502 Chronic embolism and thrombosis of unspecified deep veins of left lower extremity: Secondary | ICD-10-CM | POA: Diagnosis not present

## 2019-09-06 DIAGNOSIS — S91001D Unspecified open wound, right ankle, subsequent encounter: Secondary | ICD-10-CM | POA: Diagnosis not present

## 2019-09-06 DIAGNOSIS — L03115 Cellulitis of right lower limb: Secondary | ICD-10-CM | POA: Diagnosis not present

## 2019-09-06 DIAGNOSIS — R300 Dysuria: Secondary | ICD-10-CM | POA: Diagnosis not present

## 2019-09-06 DIAGNOSIS — Z6831 Body mass index (BMI) 31.0-31.9, adult: Secondary | ICD-10-CM | POA: Diagnosis not present

## 2019-09-08 DIAGNOSIS — J449 Chronic obstructive pulmonary disease, unspecified: Secondary | ICD-10-CM | POA: Diagnosis not present

## 2019-09-08 DIAGNOSIS — I480 Paroxysmal atrial fibrillation: Secondary | ICD-10-CM | POA: Diagnosis not present

## 2019-09-08 DIAGNOSIS — L03115 Cellulitis of right lower limb: Secondary | ICD-10-CM | POA: Diagnosis not present

## 2019-09-08 DIAGNOSIS — I82502 Chronic embolism and thrombosis of unspecified deep veins of left lower extremity: Secondary | ICD-10-CM | POA: Diagnosis not present

## 2019-09-08 DIAGNOSIS — N189 Chronic kidney disease, unspecified: Secondary | ICD-10-CM | POA: Diagnosis not present

## 2019-09-08 DIAGNOSIS — I2510 Atherosclerotic heart disease of native coronary artery without angina pectoris: Secondary | ICD-10-CM | POA: Diagnosis not present

## 2019-09-08 DIAGNOSIS — D631 Anemia in chronic kidney disease: Secondary | ICD-10-CM | POA: Diagnosis not present

## 2019-09-08 DIAGNOSIS — I130 Hypertensive heart and chronic kidney disease with heart failure and stage 1 through stage 4 chronic kidney disease, or unspecified chronic kidney disease: Secondary | ICD-10-CM | POA: Diagnosis not present

## 2019-09-08 DIAGNOSIS — I509 Heart failure, unspecified: Secondary | ICD-10-CM | POA: Diagnosis not present

## 2019-09-12 ENCOUNTER — Ambulatory Visit: Payer: Self-pay | Admitting: Podiatry

## 2019-09-12 DIAGNOSIS — L03115 Cellulitis of right lower limb: Secondary | ICD-10-CM | POA: Diagnosis not present

## 2019-09-12 DIAGNOSIS — J449 Chronic obstructive pulmonary disease, unspecified: Secondary | ICD-10-CM | POA: Diagnosis not present

## 2019-09-12 DIAGNOSIS — I480 Paroxysmal atrial fibrillation: Secondary | ICD-10-CM | POA: Diagnosis not present

## 2019-09-12 DIAGNOSIS — I130 Hypertensive heart and chronic kidney disease with heart failure and stage 1 through stage 4 chronic kidney disease, or unspecified chronic kidney disease: Secondary | ICD-10-CM | POA: Diagnosis not present

## 2019-09-12 DIAGNOSIS — I509 Heart failure, unspecified: Secondary | ICD-10-CM | POA: Diagnosis not present

## 2019-09-12 DIAGNOSIS — I2510 Atherosclerotic heart disease of native coronary artery without angina pectoris: Secondary | ICD-10-CM | POA: Diagnosis not present

## 2019-09-12 DIAGNOSIS — N189 Chronic kidney disease, unspecified: Secondary | ICD-10-CM | POA: Diagnosis not present

## 2019-09-12 DIAGNOSIS — I82502 Chronic embolism and thrombosis of unspecified deep veins of left lower extremity: Secondary | ICD-10-CM | POA: Diagnosis not present

## 2019-09-12 DIAGNOSIS — D631 Anemia in chronic kidney disease: Secondary | ICD-10-CM | POA: Diagnosis not present

## 2019-09-15 DIAGNOSIS — I480 Paroxysmal atrial fibrillation: Secondary | ICD-10-CM | POA: Diagnosis not present

## 2019-09-15 DIAGNOSIS — N189 Chronic kidney disease, unspecified: Secondary | ICD-10-CM | POA: Diagnosis not present

## 2019-09-15 DIAGNOSIS — I82502 Chronic embolism and thrombosis of unspecified deep veins of left lower extremity: Secondary | ICD-10-CM | POA: Diagnosis not present

## 2019-09-15 DIAGNOSIS — L03115 Cellulitis of right lower limb: Secondary | ICD-10-CM | POA: Diagnosis not present

## 2019-09-15 DIAGNOSIS — J449 Chronic obstructive pulmonary disease, unspecified: Secondary | ICD-10-CM | POA: Diagnosis not present

## 2019-09-15 DIAGNOSIS — I2510 Atherosclerotic heart disease of native coronary artery without angina pectoris: Secondary | ICD-10-CM | POA: Diagnosis not present

## 2019-09-15 DIAGNOSIS — I130 Hypertensive heart and chronic kidney disease with heart failure and stage 1 through stage 4 chronic kidney disease, or unspecified chronic kidney disease: Secondary | ICD-10-CM | POA: Diagnosis not present

## 2019-09-15 DIAGNOSIS — D631 Anemia in chronic kidney disease: Secondary | ICD-10-CM | POA: Diagnosis not present

## 2019-09-15 DIAGNOSIS — I509 Heart failure, unspecified: Secondary | ICD-10-CM | POA: Diagnosis not present

## 2019-09-19 DIAGNOSIS — L03115 Cellulitis of right lower limb: Secondary | ICD-10-CM | POA: Diagnosis not present

## 2019-09-19 DIAGNOSIS — I509 Heart failure, unspecified: Secondary | ICD-10-CM | POA: Diagnosis not present

## 2019-09-19 DIAGNOSIS — I480 Paroxysmal atrial fibrillation: Secondary | ICD-10-CM | POA: Diagnosis not present

## 2019-09-19 DIAGNOSIS — N189 Chronic kidney disease, unspecified: Secondary | ICD-10-CM | POA: Diagnosis not present

## 2019-09-19 DIAGNOSIS — J449 Chronic obstructive pulmonary disease, unspecified: Secondary | ICD-10-CM | POA: Diagnosis not present

## 2019-09-19 DIAGNOSIS — I2510 Atherosclerotic heart disease of native coronary artery without angina pectoris: Secondary | ICD-10-CM | POA: Diagnosis not present

## 2019-09-19 DIAGNOSIS — D631 Anemia in chronic kidney disease: Secondary | ICD-10-CM | POA: Diagnosis not present

## 2019-09-19 DIAGNOSIS — I82502 Chronic embolism and thrombosis of unspecified deep veins of left lower extremity: Secondary | ICD-10-CM | POA: Diagnosis not present

## 2019-09-19 DIAGNOSIS — I130 Hypertensive heart and chronic kidney disease with heart failure and stage 1 through stage 4 chronic kidney disease, or unspecified chronic kidney disease: Secondary | ICD-10-CM | POA: Diagnosis not present

## 2019-09-22 DIAGNOSIS — I130 Hypertensive heart and chronic kidney disease with heart failure and stage 1 through stage 4 chronic kidney disease, or unspecified chronic kidney disease: Secondary | ICD-10-CM | POA: Diagnosis not present

## 2019-09-22 DIAGNOSIS — N189 Chronic kidney disease, unspecified: Secondary | ICD-10-CM | POA: Diagnosis not present

## 2019-09-22 DIAGNOSIS — I509 Heart failure, unspecified: Secondary | ICD-10-CM | POA: Diagnosis not present

## 2019-09-22 DIAGNOSIS — L03115 Cellulitis of right lower limb: Secondary | ICD-10-CM | POA: Diagnosis not present

## 2019-09-22 DIAGNOSIS — D631 Anemia in chronic kidney disease: Secondary | ICD-10-CM | POA: Diagnosis not present

## 2019-09-22 DIAGNOSIS — I2510 Atherosclerotic heart disease of native coronary artery without angina pectoris: Secondary | ICD-10-CM | POA: Diagnosis not present

## 2019-09-22 DIAGNOSIS — I82502 Chronic embolism and thrombosis of unspecified deep veins of left lower extremity: Secondary | ICD-10-CM | POA: Diagnosis not present

## 2019-09-22 DIAGNOSIS — J449 Chronic obstructive pulmonary disease, unspecified: Secondary | ICD-10-CM | POA: Diagnosis not present

## 2019-09-22 DIAGNOSIS — I480 Paroxysmal atrial fibrillation: Secondary | ICD-10-CM | POA: Diagnosis not present

## 2019-09-24 DIAGNOSIS — I82502 Chronic embolism and thrombosis of unspecified deep veins of left lower extremity: Secondary | ICD-10-CM | POA: Diagnosis not present

## 2019-09-24 DIAGNOSIS — L03115 Cellulitis of right lower limb: Secondary | ICD-10-CM | POA: Diagnosis not present

## 2019-09-24 DIAGNOSIS — I509 Heart failure, unspecified: Secondary | ICD-10-CM | POA: Diagnosis not present

## 2019-09-24 DIAGNOSIS — I480 Paroxysmal atrial fibrillation: Secondary | ICD-10-CM | POA: Diagnosis not present

## 2019-09-24 DIAGNOSIS — N189 Chronic kidney disease, unspecified: Secondary | ICD-10-CM | POA: Diagnosis not present

## 2019-09-24 DIAGNOSIS — D631 Anemia in chronic kidney disease: Secondary | ICD-10-CM | POA: Diagnosis not present

## 2019-09-24 DIAGNOSIS — I130 Hypertensive heart and chronic kidney disease with heart failure and stage 1 through stage 4 chronic kidney disease, or unspecified chronic kidney disease: Secondary | ICD-10-CM | POA: Diagnosis not present

## 2019-09-24 DIAGNOSIS — I2510 Atherosclerotic heart disease of native coronary artery without angina pectoris: Secondary | ICD-10-CM | POA: Diagnosis not present

## 2019-09-24 DIAGNOSIS — J449 Chronic obstructive pulmonary disease, unspecified: Secondary | ICD-10-CM | POA: Diagnosis not present

## 2019-09-26 DIAGNOSIS — L03115 Cellulitis of right lower limb: Secondary | ICD-10-CM | POA: Diagnosis not present

## 2019-09-26 DIAGNOSIS — I2510 Atherosclerotic heart disease of native coronary artery without angina pectoris: Secondary | ICD-10-CM | POA: Diagnosis not present

## 2019-09-26 DIAGNOSIS — I130 Hypertensive heart and chronic kidney disease with heart failure and stage 1 through stage 4 chronic kidney disease, or unspecified chronic kidney disease: Secondary | ICD-10-CM | POA: Diagnosis not present

## 2019-09-26 DIAGNOSIS — I82502 Chronic embolism and thrombosis of unspecified deep veins of left lower extremity: Secondary | ICD-10-CM | POA: Diagnosis not present

## 2019-09-26 DIAGNOSIS — D631 Anemia in chronic kidney disease: Secondary | ICD-10-CM | POA: Diagnosis not present

## 2019-09-26 DIAGNOSIS — J449 Chronic obstructive pulmonary disease, unspecified: Secondary | ICD-10-CM | POA: Diagnosis not present

## 2019-09-26 DIAGNOSIS — I509 Heart failure, unspecified: Secondary | ICD-10-CM | POA: Diagnosis not present

## 2019-09-26 DIAGNOSIS — N189 Chronic kidney disease, unspecified: Secondary | ICD-10-CM | POA: Diagnosis not present

## 2019-09-26 DIAGNOSIS — I480 Paroxysmal atrial fibrillation: Secondary | ICD-10-CM | POA: Diagnosis not present

## 2019-09-29 DIAGNOSIS — I2510 Atherosclerotic heart disease of native coronary artery without angina pectoris: Secondary | ICD-10-CM | POA: Diagnosis not present

## 2019-09-29 DIAGNOSIS — L03115 Cellulitis of right lower limb: Secondary | ICD-10-CM | POA: Diagnosis not present

## 2019-09-29 DIAGNOSIS — I82502 Chronic embolism and thrombosis of unspecified deep veins of left lower extremity: Secondary | ICD-10-CM | POA: Diagnosis not present

## 2019-09-29 DIAGNOSIS — I509 Heart failure, unspecified: Secondary | ICD-10-CM | POA: Diagnosis not present

## 2019-09-29 DIAGNOSIS — J449 Chronic obstructive pulmonary disease, unspecified: Secondary | ICD-10-CM | POA: Diagnosis not present

## 2019-09-29 DIAGNOSIS — N189 Chronic kidney disease, unspecified: Secondary | ICD-10-CM | POA: Diagnosis not present

## 2019-09-29 DIAGNOSIS — I480 Paroxysmal atrial fibrillation: Secondary | ICD-10-CM | POA: Diagnosis not present

## 2019-09-29 DIAGNOSIS — D631 Anemia in chronic kidney disease: Secondary | ICD-10-CM | POA: Diagnosis not present

## 2019-09-29 DIAGNOSIS — I130 Hypertensive heart and chronic kidney disease with heart failure and stage 1 through stage 4 chronic kidney disease, or unspecified chronic kidney disease: Secondary | ICD-10-CM | POA: Diagnosis not present

## 2019-10-03 DIAGNOSIS — L03115 Cellulitis of right lower limb: Secondary | ICD-10-CM | POA: Diagnosis not present

## 2019-10-03 DIAGNOSIS — I509 Heart failure, unspecified: Secondary | ICD-10-CM | POA: Diagnosis not present

## 2019-10-03 DIAGNOSIS — I480 Paroxysmal atrial fibrillation: Secondary | ICD-10-CM | POA: Diagnosis not present

## 2019-10-03 DIAGNOSIS — D631 Anemia in chronic kidney disease: Secondary | ICD-10-CM | POA: Diagnosis not present

## 2019-10-03 DIAGNOSIS — I2510 Atherosclerotic heart disease of native coronary artery without angina pectoris: Secondary | ICD-10-CM | POA: Diagnosis not present

## 2019-10-03 DIAGNOSIS — N189 Chronic kidney disease, unspecified: Secondary | ICD-10-CM | POA: Diagnosis not present

## 2019-10-03 DIAGNOSIS — I130 Hypertensive heart and chronic kidney disease with heart failure and stage 1 through stage 4 chronic kidney disease, or unspecified chronic kidney disease: Secondary | ICD-10-CM | POA: Diagnosis not present

## 2019-10-03 DIAGNOSIS — I82502 Chronic embolism and thrombosis of unspecified deep veins of left lower extremity: Secondary | ICD-10-CM | POA: Diagnosis not present

## 2019-10-03 DIAGNOSIS — J449 Chronic obstructive pulmonary disease, unspecified: Secondary | ICD-10-CM | POA: Diagnosis not present

## 2019-10-06 DIAGNOSIS — J449 Chronic obstructive pulmonary disease, unspecified: Secondary | ICD-10-CM | POA: Diagnosis not present

## 2019-10-06 DIAGNOSIS — I82502 Chronic embolism and thrombosis of unspecified deep veins of left lower extremity: Secondary | ICD-10-CM | POA: Diagnosis not present

## 2019-10-06 DIAGNOSIS — I509 Heart failure, unspecified: Secondary | ICD-10-CM | POA: Diagnosis not present

## 2019-10-06 DIAGNOSIS — D631 Anemia in chronic kidney disease: Secondary | ICD-10-CM | POA: Diagnosis not present

## 2019-10-06 DIAGNOSIS — I130 Hypertensive heart and chronic kidney disease with heart failure and stage 1 through stage 4 chronic kidney disease, or unspecified chronic kidney disease: Secondary | ICD-10-CM | POA: Diagnosis not present

## 2019-10-06 DIAGNOSIS — I2510 Atherosclerotic heart disease of native coronary artery without angina pectoris: Secondary | ICD-10-CM | POA: Diagnosis not present

## 2019-10-06 DIAGNOSIS — L03115 Cellulitis of right lower limb: Secondary | ICD-10-CM | POA: Diagnosis not present

## 2019-10-06 DIAGNOSIS — N189 Chronic kidney disease, unspecified: Secondary | ICD-10-CM | POA: Diagnosis not present

## 2019-10-06 DIAGNOSIS — I480 Paroxysmal atrial fibrillation: Secondary | ICD-10-CM | POA: Diagnosis not present

## 2019-10-10 DIAGNOSIS — D631 Anemia in chronic kidney disease: Secondary | ICD-10-CM | POA: Diagnosis not present

## 2019-10-10 DIAGNOSIS — L03115 Cellulitis of right lower limb: Secondary | ICD-10-CM | POA: Diagnosis not present

## 2019-10-10 DIAGNOSIS — I2510 Atherosclerotic heart disease of native coronary artery without angina pectoris: Secondary | ICD-10-CM | POA: Diagnosis not present

## 2019-10-10 DIAGNOSIS — J449 Chronic obstructive pulmonary disease, unspecified: Secondary | ICD-10-CM | POA: Diagnosis not present

## 2019-10-10 DIAGNOSIS — I480 Paroxysmal atrial fibrillation: Secondary | ICD-10-CM | POA: Diagnosis not present

## 2019-10-10 DIAGNOSIS — I130 Hypertensive heart and chronic kidney disease with heart failure and stage 1 through stage 4 chronic kidney disease, or unspecified chronic kidney disease: Secondary | ICD-10-CM | POA: Diagnosis not present

## 2019-10-10 DIAGNOSIS — I82502 Chronic embolism and thrombosis of unspecified deep veins of left lower extremity: Secondary | ICD-10-CM | POA: Diagnosis not present

## 2019-10-10 DIAGNOSIS — I509 Heart failure, unspecified: Secondary | ICD-10-CM | POA: Diagnosis not present

## 2019-10-10 DIAGNOSIS — N189 Chronic kidney disease, unspecified: Secondary | ICD-10-CM | POA: Diagnosis not present

## 2019-10-13 DIAGNOSIS — I480 Paroxysmal atrial fibrillation: Secondary | ICD-10-CM | POA: Diagnosis not present

## 2019-10-13 DIAGNOSIS — I2510 Atherosclerotic heart disease of native coronary artery without angina pectoris: Secondary | ICD-10-CM | POA: Diagnosis not present

## 2019-10-13 DIAGNOSIS — D631 Anemia in chronic kidney disease: Secondary | ICD-10-CM | POA: Diagnosis not present

## 2019-10-13 DIAGNOSIS — I130 Hypertensive heart and chronic kidney disease with heart failure and stage 1 through stage 4 chronic kidney disease, or unspecified chronic kidney disease: Secondary | ICD-10-CM | POA: Diagnosis not present

## 2019-10-13 DIAGNOSIS — L03115 Cellulitis of right lower limb: Secondary | ICD-10-CM | POA: Diagnosis not present

## 2019-10-13 DIAGNOSIS — I509 Heart failure, unspecified: Secondary | ICD-10-CM | POA: Diagnosis not present

## 2019-10-13 DIAGNOSIS — J449 Chronic obstructive pulmonary disease, unspecified: Secondary | ICD-10-CM | POA: Diagnosis not present

## 2019-10-13 DIAGNOSIS — N189 Chronic kidney disease, unspecified: Secondary | ICD-10-CM | POA: Diagnosis not present

## 2019-10-13 DIAGNOSIS — I82502 Chronic embolism and thrombosis of unspecified deep veins of left lower extremity: Secondary | ICD-10-CM | POA: Diagnosis not present

## 2019-10-17 DIAGNOSIS — D631 Anemia in chronic kidney disease: Secondary | ICD-10-CM | POA: Diagnosis not present

## 2019-10-17 DIAGNOSIS — I480 Paroxysmal atrial fibrillation: Secondary | ICD-10-CM | POA: Diagnosis not present

## 2019-10-17 DIAGNOSIS — L03115 Cellulitis of right lower limb: Secondary | ICD-10-CM | POA: Diagnosis not present

## 2019-10-17 DIAGNOSIS — J449 Chronic obstructive pulmonary disease, unspecified: Secondary | ICD-10-CM | POA: Diagnosis not present

## 2019-10-17 DIAGNOSIS — I509 Heart failure, unspecified: Secondary | ICD-10-CM | POA: Diagnosis not present

## 2019-10-17 DIAGNOSIS — I130 Hypertensive heart and chronic kidney disease with heart failure and stage 1 through stage 4 chronic kidney disease, or unspecified chronic kidney disease: Secondary | ICD-10-CM | POA: Diagnosis not present

## 2019-10-17 DIAGNOSIS — N189 Chronic kidney disease, unspecified: Secondary | ICD-10-CM | POA: Diagnosis not present

## 2019-10-17 DIAGNOSIS — I2510 Atherosclerotic heart disease of native coronary artery without angina pectoris: Secondary | ICD-10-CM | POA: Diagnosis not present

## 2019-10-17 DIAGNOSIS — I82502 Chronic embolism and thrombosis of unspecified deep veins of left lower extremity: Secondary | ICD-10-CM | POA: Diagnosis not present

## 2019-10-18 DIAGNOSIS — Z23 Encounter for immunization: Secondary | ICD-10-CM | POA: Diagnosis not present

## 2019-10-18 DIAGNOSIS — R634 Abnormal weight loss: Secondary | ICD-10-CM | POA: Diagnosis not present

## 2019-10-18 DIAGNOSIS — I10 Essential (primary) hypertension: Secondary | ICD-10-CM | POA: Diagnosis not present

## 2019-10-18 DIAGNOSIS — R300 Dysuria: Secondary | ICD-10-CM | POA: Diagnosis not present

## 2019-10-18 DIAGNOSIS — Z6828 Body mass index (BMI) 28.0-28.9, adult: Secondary | ICD-10-CM | POA: Diagnosis not present

## 2019-10-18 DIAGNOSIS — M47816 Spondylosis without myelopathy or radiculopathy, lumbar region: Secondary | ICD-10-CM | POA: Diagnosis not present

## 2019-10-18 DIAGNOSIS — J449 Chronic obstructive pulmonary disease, unspecified: Secondary | ICD-10-CM | POA: Diagnosis not present

## 2019-10-18 DIAGNOSIS — N390 Urinary tract infection, site not specified: Secondary | ICD-10-CM | POA: Diagnosis not present

## 2019-10-18 DIAGNOSIS — I4819 Other persistent atrial fibrillation: Secondary | ICD-10-CM | POA: Diagnosis not present

## 2019-10-19 DIAGNOSIS — D631 Anemia in chronic kidney disease: Secondary | ICD-10-CM | POA: Diagnosis not present

## 2019-10-19 DIAGNOSIS — I130 Hypertensive heart and chronic kidney disease with heart failure and stage 1 through stage 4 chronic kidney disease, or unspecified chronic kidney disease: Secondary | ICD-10-CM | POA: Diagnosis not present

## 2019-10-19 DIAGNOSIS — J449 Chronic obstructive pulmonary disease, unspecified: Secondary | ICD-10-CM | POA: Diagnosis not present

## 2019-10-19 DIAGNOSIS — I2510 Atherosclerotic heart disease of native coronary artery without angina pectoris: Secondary | ICD-10-CM | POA: Diagnosis not present

## 2019-10-19 DIAGNOSIS — N189 Chronic kidney disease, unspecified: Secondary | ICD-10-CM | POA: Diagnosis not present

## 2019-10-19 DIAGNOSIS — L03115 Cellulitis of right lower limb: Secondary | ICD-10-CM | POA: Diagnosis not present

## 2019-10-19 DIAGNOSIS — I509 Heart failure, unspecified: Secondary | ICD-10-CM | POA: Diagnosis not present

## 2019-10-19 DIAGNOSIS — I480 Paroxysmal atrial fibrillation: Secondary | ICD-10-CM | POA: Diagnosis not present

## 2019-10-19 DIAGNOSIS — I82502 Chronic embolism and thrombosis of unspecified deep veins of left lower extremity: Secondary | ICD-10-CM | POA: Diagnosis not present

## 2019-10-24 DIAGNOSIS — I82502 Chronic embolism and thrombosis of unspecified deep veins of left lower extremity: Secondary | ICD-10-CM | POA: Diagnosis not present

## 2019-10-24 DIAGNOSIS — I130 Hypertensive heart and chronic kidney disease with heart failure and stage 1 through stage 4 chronic kidney disease, or unspecified chronic kidney disease: Secondary | ICD-10-CM | POA: Diagnosis not present

## 2019-10-24 DIAGNOSIS — I2510 Atherosclerotic heart disease of native coronary artery without angina pectoris: Secondary | ICD-10-CM | POA: Diagnosis not present

## 2019-10-24 DIAGNOSIS — L03115 Cellulitis of right lower limb: Secondary | ICD-10-CM | POA: Diagnosis not present

## 2019-10-24 DIAGNOSIS — J449 Chronic obstructive pulmonary disease, unspecified: Secondary | ICD-10-CM | POA: Diagnosis not present

## 2019-10-24 DIAGNOSIS — L97319 Non-pressure chronic ulcer of right ankle with unspecified severity: Secondary | ICD-10-CM | POA: Diagnosis not present

## 2019-10-24 DIAGNOSIS — N189 Chronic kidney disease, unspecified: Secondary | ICD-10-CM | POA: Diagnosis not present

## 2019-10-24 DIAGNOSIS — I480 Paroxysmal atrial fibrillation: Secondary | ICD-10-CM | POA: Diagnosis not present

## 2019-10-24 DIAGNOSIS — I509 Heart failure, unspecified: Secondary | ICD-10-CM | POA: Diagnosis not present

## 2019-10-24 DIAGNOSIS — D631 Anemia in chronic kidney disease: Secondary | ICD-10-CM | POA: Diagnosis not present

## 2019-10-27 DIAGNOSIS — L97319 Non-pressure chronic ulcer of right ankle with unspecified severity: Secondary | ICD-10-CM | POA: Diagnosis not present

## 2019-10-27 DIAGNOSIS — I509 Heart failure, unspecified: Secondary | ICD-10-CM | POA: Diagnosis not present

## 2019-10-27 DIAGNOSIS — J449 Chronic obstructive pulmonary disease, unspecified: Secondary | ICD-10-CM | POA: Diagnosis not present

## 2019-10-27 DIAGNOSIS — N189 Chronic kidney disease, unspecified: Secondary | ICD-10-CM | POA: Diagnosis not present

## 2019-10-27 DIAGNOSIS — I130 Hypertensive heart and chronic kidney disease with heart failure and stage 1 through stage 4 chronic kidney disease, or unspecified chronic kidney disease: Secondary | ICD-10-CM | POA: Diagnosis not present

## 2019-10-27 DIAGNOSIS — L03115 Cellulitis of right lower limb: Secondary | ICD-10-CM | POA: Diagnosis not present

## 2019-10-27 DIAGNOSIS — I480 Paroxysmal atrial fibrillation: Secondary | ICD-10-CM | POA: Diagnosis not present

## 2019-10-27 DIAGNOSIS — D631 Anemia in chronic kidney disease: Secondary | ICD-10-CM | POA: Diagnosis not present

## 2019-10-27 DIAGNOSIS — I2510 Atherosclerotic heart disease of native coronary artery without angina pectoris: Secondary | ICD-10-CM | POA: Diagnosis not present

## 2019-11-02 DIAGNOSIS — L03115 Cellulitis of right lower limb: Secondary | ICD-10-CM | POA: Diagnosis not present

## 2019-11-02 DIAGNOSIS — I480 Paroxysmal atrial fibrillation: Secondary | ICD-10-CM | POA: Diagnosis not present

## 2019-11-02 DIAGNOSIS — D631 Anemia in chronic kidney disease: Secondary | ICD-10-CM | POA: Diagnosis not present

## 2019-11-02 DIAGNOSIS — N189 Chronic kidney disease, unspecified: Secondary | ICD-10-CM | POA: Diagnosis not present

## 2019-11-02 DIAGNOSIS — J449 Chronic obstructive pulmonary disease, unspecified: Secondary | ICD-10-CM | POA: Diagnosis not present

## 2019-11-02 DIAGNOSIS — I2510 Atherosclerotic heart disease of native coronary artery without angina pectoris: Secondary | ICD-10-CM | POA: Diagnosis not present

## 2019-11-02 DIAGNOSIS — I509 Heart failure, unspecified: Secondary | ICD-10-CM | POA: Diagnosis not present

## 2019-11-02 DIAGNOSIS — L97319 Non-pressure chronic ulcer of right ankle with unspecified severity: Secondary | ICD-10-CM | POA: Diagnosis not present

## 2019-11-02 DIAGNOSIS — I130 Hypertensive heart and chronic kidney disease with heart failure and stage 1 through stage 4 chronic kidney disease, or unspecified chronic kidney disease: Secondary | ICD-10-CM | POA: Diagnosis not present

## 2019-11-03 DIAGNOSIS — I2510 Atherosclerotic heart disease of native coronary artery without angina pectoris: Secondary | ICD-10-CM | POA: Diagnosis not present

## 2019-11-03 DIAGNOSIS — N189 Chronic kidney disease, unspecified: Secondary | ICD-10-CM | POA: Diagnosis not present

## 2019-11-03 DIAGNOSIS — I480 Paroxysmal atrial fibrillation: Secondary | ICD-10-CM | POA: Diagnosis not present

## 2019-11-03 DIAGNOSIS — L97319 Non-pressure chronic ulcer of right ankle with unspecified severity: Secondary | ICD-10-CM | POA: Diagnosis not present

## 2019-11-03 DIAGNOSIS — I130 Hypertensive heart and chronic kidney disease with heart failure and stage 1 through stage 4 chronic kidney disease, or unspecified chronic kidney disease: Secondary | ICD-10-CM | POA: Diagnosis not present

## 2019-11-03 DIAGNOSIS — D631 Anemia in chronic kidney disease: Secondary | ICD-10-CM | POA: Diagnosis not present

## 2019-11-03 DIAGNOSIS — L03115 Cellulitis of right lower limb: Secondary | ICD-10-CM | POA: Diagnosis not present

## 2019-11-03 DIAGNOSIS — J449 Chronic obstructive pulmonary disease, unspecified: Secondary | ICD-10-CM | POA: Diagnosis not present

## 2019-11-03 DIAGNOSIS — I509 Heart failure, unspecified: Secondary | ICD-10-CM | POA: Diagnosis not present

## 2019-11-09 DIAGNOSIS — I2510 Atherosclerotic heart disease of native coronary artery without angina pectoris: Secondary | ICD-10-CM | POA: Diagnosis not present

## 2019-11-09 DIAGNOSIS — J449 Chronic obstructive pulmonary disease, unspecified: Secondary | ICD-10-CM | POA: Diagnosis not present

## 2019-11-09 DIAGNOSIS — L97319 Non-pressure chronic ulcer of right ankle with unspecified severity: Secondary | ICD-10-CM | POA: Diagnosis not present

## 2019-11-09 DIAGNOSIS — N189 Chronic kidney disease, unspecified: Secondary | ICD-10-CM | POA: Diagnosis not present

## 2019-11-09 DIAGNOSIS — I509 Heart failure, unspecified: Secondary | ICD-10-CM | POA: Diagnosis not present

## 2019-11-09 DIAGNOSIS — D631 Anemia in chronic kidney disease: Secondary | ICD-10-CM | POA: Diagnosis not present

## 2019-11-09 DIAGNOSIS — I480 Paroxysmal atrial fibrillation: Secondary | ICD-10-CM | POA: Diagnosis not present

## 2019-11-09 DIAGNOSIS — I130 Hypertensive heart and chronic kidney disease with heart failure and stage 1 through stage 4 chronic kidney disease, or unspecified chronic kidney disease: Secondary | ICD-10-CM | POA: Diagnosis not present

## 2019-11-09 DIAGNOSIS — L03115 Cellulitis of right lower limb: Secondary | ICD-10-CM | POA: Diagnosis not present

## 2019-11-17 DIAGNOSIS — L03115 Cellulitis of right lower limb: Secondary | ICD-10-CM | POA: Diagnosis not present

## 2019-11-17 DIAGNOSIS — I2510 Atherosclerotic heart disease of native coronary artery without angina pectoris: Secondary | ICD-10-CM | POA: Diagnosis not present

## 2019-11-17 DIAGNOSIS — N189 Chronic kidney disease, unspecified: Secondary | ICD-10-CM | POA: Diagnosis not present

## 2019-11-17 DIAGNOSIS — L97319 Non-pressure chronic ulcer of right ankle with unspecified severity: Secondary | ICD-10-CM | POA: Diagnosis not present

## 2019-11-17 DIAGNOSIS — D631 Anemia in chronic kidney disease: Secondary | ICD-10-CM | POA: Diagnosis not present

## 2019-11-17 DIAGNOSIS — I130 Hypertensive heart and chronic kidney disease with heart failure and stage 1 through stage 4 chronic kidney disease, or unspecified chronic kidney disease: Secondary | ICD-10-CM | POA: Diagnosis not present

## 2019-11-17 DIAGNOSIS — I480 Paroxysmal atrial fibrillation: Secondary | ICD-10-CM | POA: Diagnosis not present

## 2019-11-17 DIAGNOSIS — J449 Chronic obstructive pulmonary disease, unspecified: Secondary | ICD-10-CM | POA: Diagnosis not present

## 2019-11-17 DIAGNOSIS — I509 Heart failure, unspecified: Secondary | ICD-10-CM | POA: Diagnosis not present

## 2019-11-22 DIAGNOSIS — L97319 Non-pressure chronic ulcer of right ankle with unspecified severity: Secondary | ICD-10-CM | POA: Diagnosis not present

## 2019-11-22 DIAGNOSIS — D631 Anemia in chronic kidney disease: Secondary | ICD-10-CM | POA: Diagnosis not present

## 2019-11-22 DIAGNOSIS — L03115 Cellulitis of right lower limb: Secondary | ICD-10-CM | POA: Diagnosis not present

## 2019-11-22 DIAGNOSIS — J449 Chronic obstructive pulmonary disease, unspecified: Secondary | ICD-10-CM | POA: Diagnosis not present

## 2019-11-22 DIAGNOSIS — I509 Heart failure, unspecified: Secondary | ICD-10-CM | POA: Diagnosis not present

## 2019-11-22 DIAGNOSIS — I480 Paroxysmal atrial fibrillation: Secondary | ICD-10-CM | POA: Diagnosis not present

## 2019-11-22 DIAGNOSIS — I2510 Atherosclerotic heart disease of native coronary artery without angina pectoris: Secondary | ICD-10-CM | POA: Diagnosis not present

## 2019-11-22 DIAGNOSIS — I130 Hypertensive heart and chronic kidney disease with heart failure and stage 1 through stage 4 chronic kidney disease, or unspecified chronic kidney disease: Secondary | ICD-10-CM | POA: Diagnosis not present

## 2019-11-22 DIAGNOSIS — N189 Chronic kidney disease, unspecified: Secondary | ICD-10-CM | POA: Diagnosis not present

## 2019-11-23 DIAGNOSIS — I480 Paroxysmal atrial fibrillation: Secondary | ICD-10-CM | POA: Diagnosis not present

## 2019-11-23 DIAGNOSIS — I82502 Chronic embolism and thrombosis of unspecified deep veins of left lower extremity: Secondary | ICD-10-CM | POA: Diagnosis not present

## 2019-11-23 DIAGNOSIS — L97319 Non-pressure chronic ulcer of right ankle with unspecified severity: Secondary | ICD-10-CM | POA: Diagnosis not present

## 2019-11-23 DIAGNOSIS — N189 Chronic kidney disease, unspecified: Secondary | ICD-10-CM | POA: Diagnosis not present

## 2019-11-23 DIAGNOSIS — D631 Anemia in chronic kidney disease: Secondary | ICD-10-CM | POA: Diagnosis not present

## 2019-11-23 DIAGNOSIS — I130 Hypertensive heart and chronic kidney disease with heart failure and stage 1 through stage 4 chronic kidney disease, or unspecified chronic kidney disease: Secondary | ICD-10-CM | POA: Diagnosis not present

## 2019-11-23 DIAGNOSIS — L03115 Cellulitis of right lower limb: Secondary | ICD-10-CM | POA: Diagnosis not present

## 2019-11-23 DIAGNOSIS — J449 Chronic obstructive pulmonary disease, unspecified: Secondary | ICD-10-CM | POA: Diagnosis not present

## 2019-11-23 DIAGNOSIS — I2510 Atherosclerotic heart disease of native coronary artery without angina pectoris: Secondary | ICD-10-CM | POA: Diagnosis not present

## 2019-11-23 DIAGNOSIS — I509 Heart failure, unspecified: Secondary | ICD-10-CM | POA: Diagnosis not present

## 2019-12-01 DIAGNOSIS — I509 Heart failure, unspecified: Secondary | ICD-10-CM | POA: Diagnosis not present

## 2019-12-01 DIAGNOSIS — L97319 Non-pressure chronic ulcer of right ankle with unspecified severity: Secondary | ICD-10-CM | POA: Diagnosis not present

## 2019-12-01 DIAGNOSIS — N189 Chronic kidney disease, unspecified: Secondary | ICD-10-CM | POA: Diagnosis not present

## 2019-12-01 DIAGNOSIS — D631 Anemia in chronic kidney disease: Secondary | ICD-10-CM | POA: Diagnosis not present

## 2019-12-01 DIAGNOSIS — J449 Chronic obstructive pulmonary disease, unspecified: Secondary | ICD-10-CM | POA: Diagnosis not present

## 2019-12-01 DIAGNOSIS — L03115 Cellulitis of right lower limb: Secondary | ICD-10-CM | POA: Diagnosis not present

## 2019-12-01 DIAGNOSIS — I480 Paroxysmal atrial fibrillation: Secondary | ICD-10-CM | POA: Diagnosis not present

## 2019-12-01 DIAGNOSIS — I2510 Atherosclerotic heart disease of native coronary artery without angina pectoris: Secondary | ICD-10-CM | POA: Diagnosis not present

## 2019-12-01 DIAGNOSIS — I130 Hypertensive heart and chronic kidney disease with heart failure and stage 1 through stage 4 chronic kidney disease, or unspecified chronic kidney disease: Secondary | ICD-10-CM | POA: Diagnosis not present

## 2019-12-03 DIAGNOSIS — L97319 Non-pressure chronic ulcer of right ankle with unspecified severity: Secondary | ICD-10-CM | POA: Diagnosis not present

## 2019-12-03 DIAGNOSIS — J449 Chronic obstructive pulmonary disease, unspecified: Secondary | ICD-10-CM | POA: Diagnosis not present

## 2019-12-03 DIAGNOSIS — I480 Paroxysmal atrial fibrillation: Secondary | ICD-10-CM | POA: Diagnosis not present

## 2019-12-03 DIAGNOSIS — D631 Anemia in chronic kidney disease: Secondary | ICD-10-CM | POA: Diagnosis not present

## 2019-12-03 DIAGNOSIS — N189 Chronic kidney disease, unspecified: Secondary | ICD-10-CM | POA: Diagnosis not present

## 2019-12-03 DIAGNOSIS — I2510 Atherosclerotic heart disease of native coronary artery without angina pectoris: Secondary | ICD-10-CM | POA: Diagnosis not present

## 2019-12-03 DIAGNOSIS — I509 Heart failure, unspecified: Secondary | ICD-10-CM | POA: Diagnosis not present

## 2019-12-03 DIAGNOSIS — I130 Hypertensive heart and chronic kidney disease with heart failure and stage 1 through stage 4 chronic kidney disease, or unspecified chronic kidney disease: Secondary | ICD-10-CM | POA: Diagnosis not present

## 2019-12-03 DIAGNOSIS — L03115 Cellulitis of right lower limb: Secondary | ICD-10-CM | POA: Diagnosis not present

## 2019-12-07 DIAGNOSIS — N189 Chronic kidney disease, unspecified: Secondary | ICD-10-CM | POA: Diagnosis not present

## 2019-12-07 DIAGNOSIS — I2510 Atherosclerotic heart disease of native coronary artery without angina pectoris: Secondary | ICD-10-CM | POA: Diagnosis not present

## 2019-12-07 DIAGNOSIS — I509 Heart failure, unspecified: Secondary | ICD-10-CM | POA: Diagnosis not present

## 2019-12-07 DIAGNOSIS — I480 Paroxysmal atrial fibrillation: Secondary | ICD-10-CM | POA: Diagnosis not present

## 2019-12-07 DIAGNOSIS — L97319 Non-pressure chronic ulcer of right ankle with unspecified severity: Secondary | ICD-10-CM | POA: Diagnosis not present

## 2019-12-07 DIAGNOSIS — J449 Chronic obstructive pulmonary disease, unspecified: Secondary | ICD-10-CM | POA: Diagnosis not present

## 2019-12-07 DIAGNOSIS — L03115 Cellulitis of right lower limb: Secondary | ICD-10-CM | POA: Diagnosis not present

## 2019-12-07 DIAGNOSIS — I130 Hypertensive heart and chronic kidney disease with heart failure and stage 1 through stage 4 chronic kidney disease, or unspecified chronic kidney disease: Secondary | ICD-10-CM | POA: Diagnosis not present

## 2019-12-07 DIAGNOSIS — D631 Anemia in chronic kidney disease: Secondary | ICD-10-CM | POA: Diagnosis not present

## 2019-12-15 DIAGNOSIS — I509 Heart failure, unspecified: Secondary | ICD-10-CM | POA: Diagnosis not present

## 2019-12-15 DIAGNOSIS — I480 Paroxysmal atrial fibrillation: Secondary | ICD-10-CM | POA: Diagnosis not present

## 2019-12-15 DIAGNOSIS — N390 Urinary tract infection, site not specified: Secondary | ICD-10-CM | POA: Diagnosis not present

## 2019-12-15 DIAGNOSIS — C61 Malignant neoplasm of prostate: Secondary | ICD-10-CM | POA: Diagnosis not present

## 2019-12-15 DIAGNOSIS — J449 Chronic obstructive pulmonary disease, unspecified: Secondary | ICD-10-CM | POA: Diagnosis not present

## 2019-12-15 DIAGNOSIS — I2510 Atherosclerotic heart disease of native coronary artery without angina pectoris: Secondary | ICD-10-CM | POA: Diagnosis not present

## 2019-12-15 DIAGNOSIS — I130 Hypertensive heart and chronic kidney disease with heart failure and stage 1 through stage 4 chronic kidney disease, or unspecified chronic kidney disease: Secondary | ICD-10-CM | POA: Diagnosis not present

## 2019-12-15 DIAGNOSIS — D631 Anemia in chronic kidney disease: Secondary | ICD-10-CM | POA: Diagnosis not present

## 2019-12-15 DIAGNOSIS — N189 Chronic kidney disease, unspecified: Secondary | ICD-10-CM | POA: Diagnosis not present

## 2019-12-15 DIAGNOSIS — R351 Nocturia: Secondary | ICD-10-CM | POA: Diagnosis not present

## 2019-12-15 DIAGNOSIS — L03115 Cellulitis of right lower limb: Secondary | ICD-10-CM | POA: Diagnosis not present

## 2019-12-15 DIAGNOSIS — L97319 Non-pressure chronic ulcer of right ankle with unspecified severity: Secondary | ICD-10-CM | POA: Diagnosis not present

## 2019-12-19 DIAGNOSIS — M47816 Spondylosis without myelopathy or radiculopathy, lumbar region: Secondary | ICD-10-CM | POA: Diagnosis not present

## 2019-12-19 DIAGNOSIS — S91001D Unspecified open wound, right ankle, subsequent encounter: Secondary | ICD-10-CM | POA: Diagnosis not present

## 2019-12-19 DIAGNOSIS — I10 Essential (primary) hypertension: Secondary | ICD-10-CM | POA: Diagnosis not present

## 2019-12-19 DIAGNOSIS — J449 Chronic obstructive pulmonary disease, unspecified: Secondary | ICD-10-CM | POA: Diagnosis not present

## 2019-12-19 DIAGNOSIS — R634 Abnormal weight loss: Secondary | ICD-10-CM | POA: Diagnosis not present

## 2019-12-19 DIAGNOSIS — D539 Nutritional anemia, unspecified: Secondary | ICD-10-CM | POA: Diagnosis not present

## 2019-12-19 DIAGNOSIS — Z6828 Body mass index (BMI) 28.0-28.9, adult: Secondary | ICD-10-CM | POA: Diagnosis not present

## 2019-12-19 DIAGNOSIS — I4819 Other persistent atrial fibrillation: Secondary | ICD-10-CM | POA: Diagnosis not present

## 2019-12-20 DIAGNOSIS — I130 Hypertensive heart and chronic kidney disease with heart failure and stage 1 through stage 4 chronic kidney disease, or unspecified chronic kidney disease: Secondary | ICD-10-CM | POA: Diagnosis not present

## 2019-12-20 DIAGNOSIS — L97319 Non-pressure chronic ulcer of right ankle with unspecified severity: Secondary | ICD-10-CM | POA: Diagnosis not present

## 2019-12-20 DIAGNOSIS — J449 Chronic obstructive pulmonary disease, unspecified: Secondary | ICD-10-CM | POA: Diagnosis not present

## 2019-12-20 DIAGNOSIS — I480 Paroxysmal atrial fibrillation: Secondary | ICD-10-CM | POA: Diagnosis not present

## 2019-12-20 DIAGNOSIS — I2510 Atherosclerotic heart disease of native coronary artery without angina pectoris: Secondary | ICD-10-CM | POA: Diagnosis not present

## 2019-12-20 DIAGNOSIS — N189 Chronic kidney disease, unspecified: Secondary | ICD-10-CM | POA: Diagnosis not present

## 2019-12-20 DIAGNOSIS — I509 Heart failure, unspecified: Secondary | ICD-10-CM | POA: Diagnosis not present

## 2019-12-20 DIAGNOSIS — L03115 Cellulitis of right lower limb: Secondary | ICD-10-CM | POA: Diagnosis not present

## 2019-12-20 DIAGNOSIS — D631 Anemia in chronic kidney disease: Secondary | ICD-10-CM | POA: Diagnosis not present

## 2019-12-23 DIAGNOSIS — D631 Anemia in chronic kidney disease: Secondary | ICD-10-CM | POA: Diagnosis not present

## 2019-12-23 DIAGNOSIS — S81811A Laceration without foreign body, right lower leg, initial encounter: Secondary | ICD-10-CM | POA: Diagnosis not present

## 2019-12-23 DIAGNOSIS — L97319 Non-pressure chronic ulcer of right ankle with unspecified severity: Secondary | ICD-10-CM | POA: Diagnosis not present

## 2019-12-23 DIAGNOSIS — L03115 Cellulitis of right lower limb: Secondary | ICD-10-CM | POA: Diagnosis not present

## 2019-12-23 DIAGNOSIS — I130 Hypertensive heart and chronic kidney disease with heart failure and stage 1 through stage 4 chronic kidney disease, or unspecified chronic kidney disease: Secondary | ICD-10-CM | POA: Diagnosis not present

## 2019-12-23 DIAGNOSIS — I509 Heart failure, unspecified: Secondary | ICD-10-CM | POA: Diagnosis not present

## 2019-12-23 DIAGNOSIS — I480 Paroxysmal atrial fibrillation: Secondary | ICD-10-CM | POA: Diagnosis not present

## 2019-12-23 DIAGNOSIS — I2510 Atherosclerotic heart disease of native coronary artery without angina pectoris: Secondary | ICD-10-CM | POA: Diagnosis not present

## 2019-12-23 DIAGNOSIS — N189 Chronic kidney disease, unspecified: Secondary | ICD-10-CM | POA: Diagnosis not present

## 2019-12-28 DIAGNOSIS — I480 Paroxysmal atrial fibrillation: Secondary | ICD-10-CM | POA: Diagnosis not present

## 2019-12-28 DIAGNOSIS — N390 Urinary tract infection, site not specified: Secondary | ICD-10-CM | POA: Diagnosis not present

## 2019-12-28 DIAGNOSIS — L97319 Non-pressure chronic ulcer of right ankle with unspecified severity: Secondary | ICD-10-CM | POA: Diagnosis not present

## 2019-12-28 DIAGNOSIS — S81811A Laceration without foreign body, right lower leg, initial encounter: Secondary | ICD-10-CM | POA: Diagnosis not present

## 2019-12-28 DIAGNOSIS — I509 Heart failure, unspecified: Secondary | ICD-10-CM | POA: Diagnosis not present

## 2019-12-28 DIAGNOSIS — I130 Hypertensive heart and chronic kidney disease with heart failure and stage 1 through stage 4 chronic kidney disease, or unspecified chronic kidney disease: Secondary | ICD-10-CM | POA: Diagnosis not present

## 2019-12-28 DIAGNOSIS — I2510 Atherosclerotic heart disease of native coronary artery without angina pectoris: Secondary | ICD-10-CM | POA: Diagnosis not present

## 2019-12-28 DIAGNOSIS — D631 Anemia in chronic kidney disease: Secondary | ICD-10-CM | POA: Diagnosis not present

## 2019-12-28 DIAGNOSIS — N189 Chronic kidney disease, unspecified: Secondary | ICD-10-CM | POA: Diagnosis not present

## 2019-12-28 DIAGNOSIS — L03115 Cellulitis of right lower limb: Secondary | ICD-10-CM | POA: Diagnosis not present

## 2019-12-29 DIAGNOSIS — I480 Paroxysmal atrial fibrillation: Secondary | ICD-10-CM | POA: Diagnosis not present

## 2019-12-29 DIAGNOSIS — D631 Anemia in chronic kidney disease: Secondary | ICD-10-CM | POA: Diagnosis not present

## 2019-12-29 DIAGNOSIS — I509 Heart failure, unspecified: Secondary | ICD-10-CM | POA: Diagnosis not present

## 2019-12-29 DIAGNOSIS — S81811A Laceration without foreign body, right lower leg, initial encounter: Secondary | ICD-10-CM | POA: Diagnosis not present

## 2019-12-29 DIAGNOSIS — I2510 Atherosclerotic heart disease of native coronary artery without angina pectoris: Secondary | ICD-10-CM | POA: Diagnosis not present

## 2019-12-29 DIAGNOSIS — L03115 Cellulitis of right lower limb: Secondary | ICD-10-CM | POA: Diagnosis not present

## 2019-12-29 DIAGNOSIS — N189 Chronic kidney disease, unspecified: Secondary | ICD-10-CM | POA: Diagnosis not present

## 2019-12-29 DIAGNOSIS — L97319 Non-pressure chronic ulcer of right ankle with unspecified severity: Secondary | ICD-10-CM | POA: Diagnosis not present

## 2019-12-29 DIAGNOSIS — I130 Hypertensive heart and chronic kidney disease with heart failure and stage 1 through stage 4 chronic kidney disease, or unspecified chronic kidney disease: Secondary | ICD-10-CM | POA: Diagnosis not present

## 2020-01-02 DIAGNOSIS — Z6828 Body mass index (BMI) 28.0-28.9, adult: Secondary | ICD-10-CM | POA: Diagnosis not present

## 2020-01-02 DIAGNOSIS — R197 Diarrhea, unspecified: Secondary | ICD-10-CM | POA: Diagnosis not present

## 2020-01-02 DIAGNOSIS — R531 Weakness: Secondary | ICD-10-CM | POA: Diagnosis not present

## 2020-01-03 DIAGNOSIS — L03115 Cellulitis of right lower limb: Secondary | ICD-10-CM | POA: Diagnosis not present

## 2020-01-03 DIAGNOSIS — I509 Heart failure, unspecified: Secondary | ICD-10-CM | POA: Diagnosis not present

## 2020-01-03 DIAGNOSIS — S81811A Laceration without foreign body, right lower leg, initial encounter: Secondary | ICD-10-CM | POA: Diagnosis not present

## 2020-01-03 DIAGNOSIS — I2510 Atherosclerotic heart disease of native coronary artery without angina pectoris: Secondary | ICD-10-CM | POA: Diagnosis not present

## 2020-01-03 DIAGNOSIS — N189 Chronic kidney disease, unspecified: Secondary | ICD-10-CM | POA: Diagnosis not present

## 2020-01-03 DIAGNOSIS — I130 Hypertensive heart and chronic kidney disease with heart failure and stage 1 through stage 4 chronic kidney disease, or unspecified chronic kidney disease: Secondary | ICD-10-CM | POA: Diagnosis not present

## 2020-01-03 DIAGNOSIS — I480 Paroxysmal atrial fibrillation: Secondary | ICD-10-CM | POA: Diagnosis not present

## 2020-01-03 DIAGNOSIS — D631 Anemia in chronic kidney disease: Secondary | ICD-10-CM | POA: Diagnosis not present

## 2020-01-03 DIAGNOSIS — L97319 Non-pressure chronic ulcer of right ankle with unspecified severity: Secondary | ICD-10-CM | POA: Diagnosis not present

## 2020-01-09 DIAGNOSIS — I509 Heart failure, unspecified: Secondary | ICD-10-CM | POA: Diagnosis not present

## 2020-01-09 DIAGNOSIS — L97319 Non-pressure chronic ulcer of right ankle with unspecified severity: Secondary | ICD-10-CM | POA: Diagnosis not present

## 2020-01-09 DIAGNOSIS — I480 Paroxysmal atrial fibrillation: Secondary | ICD-10-CM | POA: Diagnosis not present

## 2020-01-09 DIAGNOSIS — I130 Hypertensive heart and chronic kidney disease with heart failure and stage 1 through stage 4 chronic kidney disease, or unspecified chronic kidney disease: Secondary | ICD-10-CM | POA: Diagnosis not present

## 2020-01-09 DIAGNOSIS — L03115 Cellulitis of right lower limb: Secondary | ICD-10-CM | POA: Diagnosis not present

## 2020-01-09 DIAGNOSIS — S81811A Laceration without foreign body, right lower leg, initial encounter: Secondary | ICD-10-CM | POA: Diagnosis not present

## 2020-01-09 DIAGNOSIS — N189 Chronic kidney disease, unspecified: Secondary | ICD-10-CM | POA: Diagnosis not present

## 2020-01-09 DIAGNOSIS — I2510 Atherosclerotic heart disease of native coronary artery without angina pectoris: Secondary | ICD-10-CM | POA: Diagnosis not present

## 2020-01-09 DIAGNOSIS — D631 Anemia in chronic kidney disease: Secondary | ICD-10-CM | POA: Diagnosis not present

## 2020-01-10 DIAGNOSIS — L03115 Cellulitis of right lower limb: Secondary | ICD-10-CM | POA: Diagnosis not present

## 2020-01-10 DIAGNOSIS — L97319 Non-pressure chronic ulcer of right ankle with unspecified severity: Secondary | ICD-10-CM | POA: Diagnosis not present

## 2020-01-10 DIAGNOSIS — D631 Anemia in chronic kidney disease: Secondary | ICD-10-CM | POA: Diagnosis not present

## 2020-01-10 DIAGNOSIS — I130 Hypertensive heart and chronic kidney disease with heart failure and stage 1 through stage 4 chronic kidney disease, or unspecified chronic kidney disease: Secondary | ICD-10-CM | POA: Diagnosis not present

## 2020-01-10 DIAGNOSIS — S81811A Laceration without foreign body, right lower leg, initial encounter: Secondary | ICD-10-CM | POA: Diagnosis not present

## 2020-01-10 DIAGNOSIS — I480 Paroxysmal atrial fibrillation: Secondary | ICD-10-CM | POA: Diagnosis not present

## 2020-01-10 DIAGNOSIS — I2510 Atherosclerotic heart disease of native coronary artery without angina pectoris: Secondary | ICD-10-CM | POA: Diagnosis not present

## 2020-01-10 DIAGNOSIS — I509 Heart failure, unspecified: Secondary | ICD-10-CM | POA: Diagnosis not present

## 2020-01-10 DIAGNOSIS — N189 Chronic kidney disease, unspecified: Secondary | ICD-10-CM | POA: Diagnosis not present

## 2020-01-19 DIAGNOSIS — L03115 Cellulitis of right lower limb: Secondary | ICD-10-CM | POA: Diagnosis not present

## 2020-01-19 DIAGNOSIS — L97319 Non-pressure chronic ulcer of right ankle with unspecified severity: Secondary | ICD-10-CM | POA: Diagnosis not present

## 2020-01-19 DIAGNOSIS — I2510 Atherosclerotic heart disease of native coronary artery without angina pectoris: Secondary | ICD-10-CM | POA: Diagnosis not present

## 2020-01-19 DIAGNOSIS — I130 Hypertensive heart and chronic kidney disease with heart failure and stage 1 through stage 4 chronic kidney disease, or unspecified chronic kidney disease: Secondary | ICD-10-CM | POA: Diagnosis not present

## 2020-01-19 DIAGNOSIS — I509 Heart failure, unspecified: Secondary | ICD-10-CM | POA: Diagnosis not present

## 2020-01-19 DIAGNOSIS — D631 Anemia in chronic kidney disease: Secondary | ICD-10-CM | POA: Diagnosis not present

## 2020-01-19 DIAGNOSIS — S81811A Laceration without foreign body, right lower leg, initial encounter: Secondary | ICD-10-CM | POA: Diagnosis not present

## 2020-01-19 DIAGNOSIS — N189 Chronic kidney disease, unspecified: Secondary | ICD-10-CM | POA: Diagnosis not present

## 2020-01-19 DIAGNOSIS — I480 Paroxysmal atrial fibrillation: Secondary | ICD-10-CM | POA: Diagnosis not present

## 2020-01-22 DIAGNOSIS — D631 Anemia in chronic kidney disease: Secondary | ICD-10-CM | POA: Diagnosis not present

## 2020-01-22 DIAGNOSIS — I480 Paroxysmal atrial fibrillation: Secondary | ICD-10-CM | POA: Diagnosis not present

## 2020-01-22 DIAGNOSIS — S81811A Laceration without foreign body, right lower leg, initial encounter: Secondary | ICD-10-CM | POA: Diagnosis not present

## 2020-01-22 DIAGNOSIS — N189 Chronic kidney disease, unspecified: Secondary | ICD-10-CM | POA: Diagnosis not present

## 2020-01-22 DIAGNOSIS — I130 Hypertensive heart and chronic kidney disease with heart failure and stage 1 through stage 4 chronic kidney disease, or unspecified chronic kidney disease: Secondary | ICD-10-CM | POA: Diagnosis not present

## 2020-01-22 DIAGNOSIS — L97319 Non-pressure chronic ulcer of right ankle with unspecified severity: Secondary | ICD-10-CM | POA: Diagnosis not present

## 2020-01-22 DIAGNOSIS — L03115 Cellulitis of right lower limb: Secondary | ICD-10-CM | POA: Diagnosis not present

## 2020-01-22 DIAGNOSIS — I509 Heart failure, unspecified: Secondary | ICD-10-CM | POA: Diagnosis not present

## 2020-01-22 DIAGNOSIS — I2510 Atherosclerotic heart disease of native coronary artery without angina pectoris: Secondary | ICD-10-CM | POA: Diagnosis not present

## 2020-01-24 DIAGNOSIS — I480 Paroxysmal atrial fibrillation: Secondary | ICD-10-CM | POA: Diagnosis not present

## 2020-01-24 DIAGNOSIS — I2510 Atherosclerotic heart disease of native coronary artery without angina pectoris: Secondary | ICD-10-CM | POA: Diagnosis not present

## 2020-01-24 DIAGNOSIS — L03115 Cellulitis of right lower limb: Secondary | ICD-10-CM | POA: Diagnosis not present

## 2020-01-24 DIAGNOSIS — N189 Chronic kidney disease, unspecified: Secondary | ICD-10-CM | POA: Diagnosis not present

## 2020-01-24 DIAGNOSIS — S81811A Laceration without foreign body, right lower leg, initial encounter: Secondary | ICD-10-CM | POA: Diagnosis not present

## 2020-01-24 DIAGNOSIS — I509 Heart failure, unspecified: Secondary | ICD-10-CM | POA: Diagnosis not present

## 2020-01-24 DIAGNOSIS — D631 Anemia in chronic kidney disease: Secondary | ICD-10-CM | POA: Diagnosis not present

## 2020-01-24 DIAGNOSIS — L97319 Non-pressure chronic ulcer of right ankle with unspecified severity: Secondary | ICD-10-CM | POA: Diagnosis not present

## 2020-01-24 DIAGNOSIS — I130 Hypertensive heart and chronic kidney disease with heart failure and stage 1 through stage 4 chronic kidney disease, or unspecified chronic kidney disease: Secondary | ICD-10-CM | POA: Diagnosis not present

## 2020-01-26 DIAGNOSIS — I2510 Atherosclerotic heart disease of native coronary artery without angina pectoris: Secondary | ICD-10-CM | POA: Diagnosis not present

## 2020-01-26 DIAGNOSIS — N189 Chronic kidney disease, unspecified: Secondary | ICD-10-CM | POA: Diagnosis not present

## 2020-01-26 DIAGNOSIS — D631 Anemia in chronic kidney disease: Secondary | ICD-10-CM | POA: Diagnosis not present

## 2020-01-26 DIAGNOSIS — L03115 Cellulitis of right lower limb: Secondary | ICD-10-CM | POA: Diagnosis not present

## 2020-01-26 DIAGNOSIS — L97319 Non-pressure chronic ulcer of right ankle with unspecified severity: Secondary | ICD-10-CM | POA: Diagnosis not present

## 2020-01-26 DIAGNOSIS — I480 Paroxysmal atrial fibrillation: Secondary | ICD-10-CM | POA: Diagnosis not present

## 2020-01-26 DIAGNOSIS — I509 Heart failure, unspecified: Secondary | ICD-10-CM | POA: Diagnosis not present

## 2020-01-26 DIAGNOSIS — S81811A Laceration without foreign body, right lower leg, initial encounter: Secondary | ICD-10-CM | POA: Diagnosis not present

## 2020-01-26 DIAGNOSIS — I130 Hypertensive heart and chronic kidney disease with heart failure and stage 1 through stage 4 chronic kidney disease, or unspecified chronic kidney disease: Secondary | ICD-10-CM | POA: Diagnosis not present

## 2020-01-31 DIAGNOSIS — D631 Anemia in chronic kidney disease: Secondary | ICD-10-CM | POA: Diagnosis not present

## 2020-01-31 DIAGNOSIS — L03115 Cellulitis of right lower limb: Secondary | ICD-10-CM | POA: Diagnosis not present

## 2020-01-31 DIAGNOSIS — I2510 Atherosclerotic heart disease of native coronary artery without angina pectoris: Secondary | ICD-10-CM | POA: Diagnosis not present

## 2020-01-31 DIAGNOSIS — N189 Chronic kidney disease, unspecified: Secondary | ICD-10-CM | POA: Diagnosis not present

## 2020-01-31 DIAGNOSIS — S81811A Laceration without foreign body, right lower leg, initial encounter: Secondary | ICD-10-CM | POA: Diagnosis not present

## 2020-01-31 DIAGNOSIS — L97319 Non-pressure chronic ulcer of right ankle with unspecified severity: Secondary | ICD-10-CM | POA: Diagnosis not present

## 2020-01-31 DIAGNOSIS — I480 Paroxysmal atrial fibrillation: Secondary | ICD-10-CM | POA: Diagnosis not present

## 2020-01-31 DIAGNOSIS — I130 Hypertensive heart and chronic kidney disease with heart failure and stage 1 through stage 4 chronic kidney disease, or unspecified chronic kidney disease: Secondary | ICD-10-CM | POA: Diagnosis not present

## 2020-01-31 DIAGNOSIS — I509 Heart failure, unspecified: Secondary | ICD-10-CM | POA: Diagnosis not present

## 2020-02-08 DIAGNOSIS — I2510 Atherosclerotic heart disease of native coronary artery without angina pectoris: Secondary | ICD-10-CM | POA: Diagnosis not present

## 2020-02-08 DIAGNOSIS — S81811A Laceration without foreign body, right lower leg, initial encounter: Secondary | ICD-10-CM | POA: Diagnosis not present

## 2020-02-08 DIAGNOSIS — L97319 Non-pressure chronic ulcer of right ankle with unspecified severity: Secondary | ICD-10-CM | POA: Diagnosis not present

## 2020-02-08 DIAGNOSIS — L03115 Cellulitis of right lower limb: Secondary | ICD-10-CM | POA: Diagnosis not present

## 2020-02-08 DIAGNOSIS — D631 Anemia in chronic kidney disease: Secondary | ICD-10-CM | POA: Diagnosis not present

## 2020-02-08 DIAGNOSIS — I480 Paroxysmal atrial fibrillation: Secondary | ICD-10-CM | POA: Diagnosis not present

## 2020-02-08 DIAGNOSIS — I509 Heart failure, unspecified: Secondary | ICD-10-CM | POA: Diagnosis not present

## 2020-02-08 DIAGNOSIS — I130 Hypertensive heart and chronic kidney disease with heart failure and stage 1 through stage 4 chronic kidney disease, or unspecified chronic kidney disease: Secondary | ICD-10-CM | POA: Diagnosis not present

## 2020-02-08 DIAGNOSIS — N189 Chronic kidney disease, unspecified: Secondary | ICD-10-CM | POA: Diagnosis not present

## 2020-02-16 DIAGNOSIS — S81811A Laceration without foreign body, right lower leg, initial encounter: Secondary | ICD-10-CM | POA: Diagnosis not present

## 2020-02-16 DIAGNOSIS — D631 Anemia in chronic kidney disease: Secondary | ICD-10-CM | POA: Diagnosis not present

## 2020-02-16 DIAGNOSIS — L03115 Cellulitis of right lower limb: Secondary | ICD-10-CM | POA: Diagnosis not present

## 2020-02-16 DIAGNOSIS — L97319 Non-pressure chronic ulcer of right ankle with unspecified severity: Secondary | ICD-10-CM | POA: Diagnosis not present

## 2020-02-16 DIAGNOSIS — I130 Hypertensive heart and chronic kidney disease with heart failure and stage 1 through stage 4 chronic kidney disease, or unspecified chronic kidney disease: Secondary | ICD-10-CM | POA: Diagnosis not present

## 2020-02-16 DIAGNOSIS — I509 Heart failure, unspecified: Secondary | ICD-10-CM | POA: Diagnosis not present

## 2020-02-16 DIAGNOSIS — I2510 Atherosclerotic heart disease of native coronary artery without angina pectoris: Secondary | ICD-10-CM | POA: Diagnosis not present

## 2020-02-16 DIAGNOSIS — I480 Paroxysmal atrial fibrillation: Secondary | ICD-10-CM | POA: Diagnosis not present

## 2020-02-16 DIAGNOSIS — N189 Chronic kidney disease, unspecified: Secondary | ICD-10-CM | POA: Diagnosis not present

## 2020-02-21 DIAGNOSIS — I2510 Atherosclerotic heart disease of native coronary artery without angina pectoris: Secondary | ICD-10-CM | POA: Diagnosis not present

## 2020-02-21 DIAGNOSIS — R634 Abnormal weight loss: Secondary | ICD-10-CM | POA: Diagnosis not present

## 2020-02-21 DIAGNOSIS — Z6827 Body mass index (BMI) 27.0-27.9, adult: Secondary | ICD-10-CM | POA: Diagnosis not present

## 2020-02-21 DIAGNOSIS — I10 Essential (primary) hypertension: Secondary | ICD-10-CM | POA: Diagnosis not present

## 2020-02-21 DIAGNOSIS — I130 Hypertensive heart and chronic kidney disease with heart failure and stage 1 through stage 4 chronic kidney disease, or unspecified chronic kidney disease: Secondary | ICD-10-CM | POA: Diagnosis not present

## 2020-02-21 DIAGNOSIS — L03115 Cellulitis of right lower limb: Secondary | ICD-10-CM | POA: Diagnosis not present

## 2020-02-21 DIAGNOSIS — S91001D Unspecified open wound, right ankle, subsequent encounter: Secondary | ICD-10-CM | POA: Diagnosis not present

## 2020-02-21 DIAGNOSIS — S81811A Laceration without foreign body, right lower leg, initial encounter: Secondary | ICD-10-CM | POA: Diagnosis not present

## 2020-02-21 DIAGNOSIS — D539 Nutritional anemia, unspecified: Secondary | ICD-10-CM | POA: Diagnosis not present

## 2020-02-21 DIAGNOSIS — D631 Anemia in chronic kidney disease: Secondary | ICD-10-CM | POA: Diagnosis not present

## 2020-02-21 DIAGNOSIS — I509 Heart failure, unspecified: Secondary | ICD-10-CM | POA: Diagnosis not present

## 2020-02-21 DIAGNOSIS — N189 Chronic kidney disease, unspecified: Secondary | ICD-10-CM | POA: Diagnosis not present

## 2020-02-21 DIAGNOSIS — E785 Hyperlipidemia, unspecified: Secondary | ICD-10-CM | POA: Diagnosis not present

## 2020-02-21 DIAGNOSIS — I4819 Other persistent atrial fibrillation: Secondary | ICD-10-CM | POA: Diagnosis not present

## 2020-02-21 DIAGNOSIS — L97319 Non-pressure chronic ulcer of right ankle with unspecified severity: Secondary | ICD-10-CM | POA: Diagnosis not present

## 2020-02-21 DIAGNOSIS — J449 Chronic obstructive pulmonary disease, unspecified: Secondary | ICD-10-CM | POA: Diagnosis not present

## 2020-02-21 DIAGNOSIS — M47816 Spondylosis without myelopathy or radiculopathy, lumbar region: Secondary | ICD-10-CM | POA: Diagnosis not present

## 2020-02-21 DIAGNOSIS — I480 Paroxysmal atrial fibrillation: Secondary | ICD-10-CM | POA: Diagnosis not present

## 2020-02-22 DIAGNOSIS — D631 Anemia in chronic kidney disease: Secondary | ICD-10-CM | POA: Diagnosis not present

## 2020-02-22 DIAGNOSIS — I2510 Atherosclerotic heart disease of native coronary artery without angina pectoris: Secondary | ICD-10-CM | POA: Diagnosis not present

## 2020-02-22 DIAGNOSIS — I509 Heart failure, unspecified: Secondary | ICD-10-CM | POA: Diagnosis not present

## 2020-02-22 DIAGNOSIS — L03115 Cellulitis of right lower limb: Secondary | ICD-10-CM | POA: Diagnosis not present

## 2020-02-22 DIAGNOSIS — I130 Hypertensive heart and chronic kidney disease with heart failure and stage 1 through stage 4 chronic kidney disease, or unspecified chronic kidney disease: Secondary | ICD-10-CM | POA: Diagnosis not present

## 2020-02-22 DIAGNOSIS — N189 Chronic kidney disease, unspecified: Secondary | ICD-10-CM | POA: Diagnosis not present

## 2020-02-22 DIAGNOSIS — I480 Paroxysmal atrial fibrillation: Secondary | ICD-10-CM | POA: Diagnosis not present

## 2020-02-22 DIAGNOSIS — S81811A Laceration without foreign body, right lower leg, initial encounter: Secondary | ICD-10-CM | POA: Diagnosis not present

## 2020-02-22 DIAGNOSIS — L97319 Non-pressure chronic ulcer of right ankle with unspecified severity: Secondary | ICD-10-CM | POA: Diagnosis not present

## 2020-02-29 DIAGNOSIS — S81811A Laceration without foreign body, right lower leg, initial encounter: Secondary | ICD-10-CM | POA: Diagnosis not present

## 2020-02-29 DIAGNOSIS — I130 Hypertensive heart and chronic kidney disease with heart failure and stage 1 through stage 4 chronic kidney disease, or unspecified chronic kidney disease: Secondary | ICD-10-CM | POA: Diagnosis not present

## 2020-02-29 DIAGNOSIS — N189 Chronic kidney disease, unspecified: Secondary | ICD-10-CM | POA: Diagnosis not present

## 2020-02-29 DIAGNOSIS — L97319 Non-pressure chronic ulcer of right ankle with unspecified severity: Secondary | ICD-10-CM | POA: Diagnosis not present

## 2020-02-29 DIAGNOSIS — I2510 Atherosclerotic heart disease of native coronary artery without angina pectoris: Secondary | ICD-10-CM | POA: Diagnosis not present

## 2020-02-29 DIAGNOSIS — I480 Paroxysmal atrial fibrillation: Secondary | ICD-10-CM | POA: Diagnosis not present

## 2020-02-29 DIAGNOSIS — I509 Heart failure, unspecified: Secondary | ICD-10-CM | POA: Diagnosis not present

## 2020-02-29 DIAGNOSIS — D631 Anemia in chronic kidney disease: Secondary | ICD-10-CM | POA: Diagnosis not present

## 2020-02-29 DIAGNOSIS — L03115 Cellulitis of right lower limb: Secondary | ICD-10-CM | POA: Diagnosis not present

## 2020-03-08 DIAGNOSIS — I2510 Atherosclerotic heart disease of native coronary artery without angina pectoris: Secondary | ICD-10-CM | POA: Diagnosis not present

## 2020-03-08 DIAGNOSIS — L03115 Cellulitis of right lower limb: Secondary | ICD-10-CM | POA: Diagnosis not present

## 2020-03-08 DIAGNOSIS — S81811A Laceration without foreign body, right lower leg, initial encounter: Secondary | ICD-10-CM | POA: Diagnosis not present

## 2020-03-08 DIAGNOSIS — I480 Paroxysmal atrial fibrillation: Secondary | ICD-10-CM | POA: Diagnosis not present

## 2020-03-08 DIAGNOSIS — L97319 Non-pressure chronic ulcer of right ankle with unspecified severity: Secondary | ICD-10-CM | POA: Diagnosis not present

## 2020-03-08 DIAGNOSIS — D631 Anemia in chronic kidney disease: Secondary | ICD-10-CM | POA: Diagnosis not present

## 2020-03-08 DIAGNOSIS — I509 Heart failure, unspecified: Secondary | ICD-10-CM | POA: Diagnosis not present

## 2020-03-08 DIAGNOSIS — N189 Chronic kidney disease, unspecified: Secondary | ICD-10-CM | POA: Diagnosis not present

## 2020-03-08 DIAGNOSIS — I130 Hypertensive heart and chronic kidney disease with heart failure and stage 1 through stage 4 chronic kidney disease, or unspecified chronic kidney disease: Secondary | ICD-10-CM | POA: Diagnosis not present

## 2020-03-14 DIAGNOSIS — D631 Anemia in chronic kidney disease: Secondary | ICD-10-CM | POA: Diagnosis not present

## 2020-03-14 DIAGNOSIS — I509 Heart failure, unspecified: Secondary | ICD-10-CM | POA: Diagnosis not present

## 2020-03-14 DIAGNOSIS — S81811A Laceration without foreign body, right lower leg, initial encounter: Secondary | ICD-10-CM | POA: Diagnosis not present

## 2020-03-14 DIAGNOSIS — L97319 Non-pressure chronic ulcer of right ankle with unspecified severity: Secondary | ICD-10-CM | POA: Diagnosis not present

## 2020-03-14 DIAGNOSIS — I2510 Atherosclerotic heart disease of native coronary artery without angina pectoris: Secondary | ICD-10-CM | POA: Diagnosis not present

## 2020-03-14 DIAGNOSIS — I130 Hypertensive heart and chronic kidney disease with heart failure and stage 1 through stage 4 chronic kidney disease, or unspecified chronic kidney disease: Secondary | ICD-10-CM | POA: Diagnosis not present

## 2020-03-14 DIAGNOSIS — N189 Chronic kidney disease, unspecified: Secondary | ICD-10-CM | POA: Diagnosis not present

## 2020-03-14 DIAGNOSIS — I480 Paroxysmal atrial fibrillation: Secondary | ICD-10-CM | POA: Diagnosis not present

## 2020-03-14 DIAGNOSIS — L03115 Cellulitis of right lower limb: Secondary | ICD-10-CM | POA: Diagnosis not present

## 2020-03-22 DIAGNOSIS — S81811A Laceration without foreign body, right lower leg, initial encounter: Secondary | ICD-10-CM | POA: Diagnosis not present

## 2020-03-22 DIAGNOSIS — I2510 Atherosclerotic heart disease of native coronary artery without angina pectoris: Secondary | ICD-10-CM | POA: Diagnosis not present

## 2020-03-22 DIAGNOSIS — I509 Heart failure, unspecified: Secondary | ICD-10-CM | POA: Diagnosis not present

## 2020-03-22 DIAGNOSIS — L03115 Cellulitis of right lower limb: Secondary | ICD-10-CM | POA: Diagnosis not present

## 2020-03-22 DIAGNOSIS — I480 Paroxysmal atrial fibrillation: Secondary | ICD-10-CM | POA: Diagnosis not present

## 2020-03-22 DIAGNOSIS — L97319 Non-pressure chronic ulcer of right ankle with unspecified severity: Secondary | ICD-10-CM | POA: Diagnosis not present

## 2020-03-22 DIAGNOSIS — D631 Anemia in chronic kidney disease: Secondary | ICD-10-CM | POA: Diagnosis not present

## 2020-03-22 DIAGNOSIS — I130 Hypertensive heart and chronic kidney disease with heart failure and stage 1 through stage 4 chronic kidney disease, or unspecified chronic kidney disease: Secondary | ICD-10-CM | POA: Diagnosis not present

## 2020-03-22 DIAGNOSIS — N189 Chronic kidney disease, unspecified: Secondary | ICD-10-CM | POA: Diagnosis not present

## 2020-03-29 DIAGNOSIS — L03115 Cellulitis of right lower limb: Secondary | ICD-10-CM | POA: Diagnosis not present

## 2020-03-29 DIAGNOSIS — I509 Heart failure, unspecified: Secondary | ICD-10-CM | POA: Diagnosis not present

## 2020-03-29 DIAGNOSIS — S81811A Laceration without foreign body, right lower leg, initial encounter: Secondary | ICD-10-CM | POA: Diagnosis not present

## 2020-03-29 DIAGNOSIS — L97319 Non-pressure chronic ulcer of right ankle with unspecified severity: Secondary | ICD-10-CM | POA: Diagnosis not present

## 2020-03-29 DIAGNOSIS — I480 Paroxysmal atrial fibrillation: Secondary | ICD-10-CM | POA: Diagnosis not present

## 2020-03-29 DIAGNOSIS — D631 Anemia in chronic kidney disease: Secondary | ICD-10-CM | POA: Diagnosis not present

## 2020-03-29 DIAGNOSIS — N189 Chronic kidney disease, unspecified: Secondary | ICD-10-CM | POA: Diagnosis not present

## 2020-03-29 DIAGNOSIS — I2510 Atherosclerotic heart disease of native coronary artery without angina pectoris: Secondary | ICD-10-CM | POA: Diagnosis not present

## 2020-03-29 DIAGNOSIS — I130 Hypertensive heart and chronic kidney disease with heart failure and stage 1 through stage 4 chronic kidney disease, or unspecified chronic kidney disease: Secondary | ICD-10-CM | POA: Diagnosis not present

## 2020-04-05 DIAGNOSIS — I480 Paroxysmal atrial fibrillation: Secondary | ICD-10-CM | POA: Diagnosis not present

## 2020-04-05 DIAGNOSIS — I2510 Atherosclerotic heart disease of native coronary artery without angina pectoris: Secondary | ICD-10-CM | POA: Diagnosis not present

## 2020-04-05 DIAGNOSIS — L97319 Non-pressure chronic ulcer of right ankle with unspecified severity: Secondary | ICD-10-CM | POA: Diagnosis not present

## 2020-04-05 DIAGNOSIS — L03115 Cellulitis of right lower limb: Secondary | ICD-10-CM | POA: Diagnosis not present

## 2020-04-05 DIAGNOSIS — D631 Anemia in chronic kidney disease: Secondary | ICD-10-CM | POA: Diagnosis not present

## 2020-04-05 DIAGNOSIS — N189 Chronic kidney disease, unspecified: Secondary | ICD-10-CM | POA: Diagnosis not present

## 2020-04-05 DIAGNOSIS — I130 Hypertensive heart and chronic kidney disease with heart failure and stage 1 through stage 4 chronic kidney disease, or unspecified chronic kidney disease: Secondary | ICD-10-CM | POA: Diagnosis not present

## 2020-04-05 DIAGNOSIS — S81811A Laceration without foreign body, right lower leg, initial encounter: Secondary | ICD-10-CM | POA: Diagnosis not present

## 2020-04-05 DIAGNOSIS — I509 Heart failure, unspecified: Secondary | ICD-10-CM | POA: Diagnosis not present

## 2020-04-12 DIAGNOSIS — S81811A Laceration without foreign body, right lower leg, initial encounter: Secondary | ICD-10-CM | POA: Diagnosis not present

## 2020-04-12 DIAGNOSIS — I130 Hypertensive heart and chronic kidney disease with heart failure and stage 1 through stage 4 chronic kidney disease, or unspecified chronic kidney disease: Secondary | ICD-10-CM | POA: Diagnosis not present

## 2020-04-12 DIAGNOSIS — I480 Paroxysmal atrial fibrillation: Secondary | ICD-10-CM | POA: Diagnosis not present

## 2020-04-12 DIAGNOSIS — L03115 Cellulitis of right lower limb: Secondary | ICD-10-CM | POA: Diagnosis not present

## 2020-04-12 DIAGNOSIS — I2510 Atherosclerotic heart disease of native coronary artery without angina pectoris: Secondary | ICD-10-CM | POA: Diagnosis not present

## 2020-04-12 DIAGNOSIS — I509 Heart failure, unspecified: Secondary | ICD-10-CM | POA: Diagnosis not present

## 2020-04-12 DIAGNOSIS — N189 Chronic kidney disease, unspecified: Secondary | ICD-10-CM | POA: Diagnosis not present

## 2020-04-12 DIAGNOSIS — L97319 Non-pressure chronic ulcer of right ankle with unspecified severity: Secondary | ICD-10-CM | POA: Diagnosis not present

## 2020-04-12 DIAGNOSIS — D631 Anemia in chronic kidney disease: Secondary | ICD-10-CM | POA: Diagnosis not present

## 2020-04-19 DIAGNOSIS — D631 Anemia in chronic kidney disease: Secondary | ICD-10-CM | POA: Diagnosis not present

## 2020-04-19 DIAGNOSIS — N189 Chronic kidney disease, unspecified: Secondary | ICD-10-CM | POA: Diagnosis not present

## 2020-04-19 DIAGNOSIS — L97319 Non-pressure chronic ulcer of right ankle with unspecified severity: Secondary | ICD-10-CM | POA: Diagnosis not present

## 2020-04-19 DIAGNOSIS — I509 Heart failure, unspecified: Secondary | ICD-10-CM | POA: Diagnosis not present

## 2020-04-19 DIAGNOSIS — I2510 Atherosclerotic heart disease of native coronary artery without angina pectoris: Secondary | ICD-10-CM | POA: Diagnosis not present

## 2020-04-19 DIAGNOSIS — S81811A Laceration without foreign body, right lower leg, initial encounter: Secondary | ICD-10-CM | POA: Diagnosis not present

## 2020-04-19 DIAGNOSIS — I480 Paroxysmal atrial fibrillation: Secondary | ICD-10-CM | POA: Diagnosis not present

## 2020-04-19 DIAGNOSIS — I130 Hypertensive heart and chronic kidney disease with heart failure and stage 1 through stage 4 chronic kidney disease, or unspecified chronic kidney disease: Secondary | ICD-10-CM | POA: Diagnosis not present

## 2020-04-19 DIAGNOSIS — L03115 Cellulitis of right lower limb: Secondary | ICD-10-CM | POA: Diagnosis not present

## 2020-04-23 DIAGNOSIS — Z6828 Body mass index (BMI) 28.0-28.9, adult: Secondary | ICD-10-CM | POA: Diagnosis not present

## 2020-04-23 DIAGNOSIS — I4819 Other persistent atrial fibrillation: Secondary | ICD-10-CM | POA: Diagnosis not present

## 2020-04-23 DIAGNOSIS — J449 Chronic obstructive pulmonary disease, unspecified: Secondary | ICD-10-CM | POA: Diagnosis not present

## 2020-04-23 DIAGNOSIS — S91001D Unspecified open wound, right ankle, subsequent encounter: Secondary | ICD-10-CM | POA: Diagnosis not present

## 2020-04-23 DIAGNOSIS — E785 Hyperlipidemia, unspecified: Secondary | ICD-10-CM | POA: Diagnosis not present

## 2020-04-23 DIAGNOSIS — M47816 Spondylosis without myelopathy or radiculopathy, lumbar region: Secondary | ICD-10-CM | POA: Diagnosis not present

## 2020-04-23 DIAGNOSIS — R609 Edema, unspecified: Secondary | ICD-10-CM | POA: Diagnosis not present

## 2020-04-23 DIAGNOSIS — D539 Nutritional anemia, unspecified: Secondary | ICD-10-CM | POA: Diagnosis not present

## 2020-04-23 DIAGNOSIS — I10 Essential (primary) hypertension: Secondary | ICD-10-CM | POA: Diagnosis not present

## 2020-05-23 DIAGNOSIS — E785 Hyperlipidemia, unspecified: Secondary | ICD-10-CM | POA: Diagnosis not present

## 2020-05-23 DIAGNOSIS — Z9181 History of falling: Secondary | ICD-10-CM | POA: Diagnosis not present

## 2020-05-23 DIAGNOSIS — Z1331 Encounter for screening for depression: Secondary | ICD-10-CM | POA: Diagnosis not present

## 2020-05-23 DIAGNOSIS — Z139 Encounter for screening, unspecified: Secondary | ICD-10-CM | POA: Diagnosis not present

## 2020-05-23 DIAGNOSIS — Z0000 Encounter for general adult medical examination without abnormal findings: Secondary | ICD-10-CM | POA: Diagnosis not present

## 2020-06-09 DIAGNOSIS — I2510 Atherosclerotic heart disease of native coronary artery without angina pectoris: Secondary | ICD-10-CM | POA: Diagnosis not present

## 2020-06-09 DIAGNOSIS — R609 Edema, unspecified: Secondary | ICD-10-CM | POA: Diagnosis not present

## 2020-06-09 DIAGNOSIS — R079 Chest pain, unspecified: Secondary | ICD-10-CM | POA: Diagnosis not present

## 2020-06-09 DIAGNOSIS — R519 Headache, unspecified: Secondary | ICD-10-CM | POA: Diagnosis not present

## 2020-06-09 DIAGNOSIS — D62 Acute posthemorrhagic anemia: Secondary | ICD-10-CM | POA: Diagnosis not present

## 2020-06-09 DIAGNOSIS — I708 Atherosclerosis of other arteries: Secondary | ICD-10-CM | POA: Diagnosis not present

## 2020-06-09 DIAGNOSIS — M25551 Pain in right hip: Secondary | ICD-10-CM | POA: Diagnosis not present

## 2020-06-09 DIAGNOSIS — W19XXXA Unspecified fall, initial encounter: Secondary | ICD-10-CM | POA: Diagnosis not present

## 2020-06-09 DIAGNOSIS — M47812 Spondylosis without myelopathy or radiculopathy, cervical region: Secondary | ICD-10-CM | POA: Diagnosis not present

## 2020-06-09 DIAGNOSIS — J90 Pleural effusion, not elsewhere classified: Secondary | ICD-10-CM | POA: Diagnosis not present

## 2020-06-09 DIAGNOSIS — R296 Repeated falls: Secondary | ICD-10-CM | POA: Diagnosis not present

## 2020-06-09 DIAGNOSIS — S7001XA Contusion of right hip, initial encounter: Secondary | ICD-10-CM | POA: Diagnosis not present

## 2020-06-09 DIAGNOSIS — I509 Heart failure, unspecified: Secondary | ICD-10-CM | POA: Diagnosis not present

## 2020-06-09 DIAGNOSIS — R600 Localized edema: Secondary | ICD-10-CM | POA: Diagnosis not present

## 2020-06-09 DIAGNOSIS — M1611 Unilateral primary osteoarthritis, right hip: Secondary | ICD-10-CM | POA: Diagnosis not present

## 2020-06-09 DIAGNOSIS — K219 Gastro-esophageal reflux disease without esophagitis: Secondary | ICD-10-CM | POA: Diagnosis not present

## 2020-06-09 DIAGNOSIS — D649 Anemia, unspecified: Secondary | ICD-10-CM | POA: Diagnosis not present

## 2020-06-09 DIAGNOSIS — I110 Hypertensive heart disease with heart failure: Secondary | ICD-10-CM | POA: Diagnosis not present

## 2020-06-09 DIAGNOSIS — G4489 Other headache syndrome: Secondary | ICD-10-CM | POA: Diagnosis not present

## 2020-06-09 DIAGNOSIS — D509 Iron deficiency anemia, unspecified: Secondary | ICD-10-CM | POA: Diagnosis not present

## 2020-06-09 DIAGNOSIS — Z043 Encounter for examination and observation following other accident: Secondary | ICD-10-CM | POA: Diagnosis not present

## 2020-06-09 DIAGNOSIS — I482 Chronic atrial fibrillation, unspecified: Secondary | ICD-10-CM | POA: Diagnosis not present

## 2020-06-09 DIAGNOSIS — I5022 Chronic systolic (congestive) heart failure: Secondary | ICD-10-CM | POA: Diagnosis not present

## 2020-06-09 DIAGNOSIS — M25572 Pain in left ankle and joints of left foot: Secondary | ICD-10-CM | POA: Diagnosis not present

## 2020-06-09 DIAGNOSIS — S79911A Unspecified injury of right hip, initial encounter: Secondary | ICD-10-CM | POA: Diagnosis not present

## 2020-06-09 DIAGNOSIS — J9811 Atelectasis: Secondary | ICD-10-CM | POA: Diagnosis not present

## 2020-06-10 DIAGNOSIS — I110 Hypertensive heart disease with heart failure: Secondary | ICD-10-CM | POA: Diagnosis not present

## 2020-06-10 DIAGNOSIS — Z043 Encounter for examination and observation following other accident: Secondary | ICD-10-CM | POA: Diagnosis not present

## 2020-06-10 DIAGNOSIS — S7001XA Contusion of right hip, initial encounter: Secondary | ICD-10-CM | POA: Diagnosis not present

## 2020-06-10 DIAGNOSIS — D649 Anemia, unspecified: Secondary | ICD-10-CM | POA: Diagnosis not present

## 2020-06-10 DIAGNOSIS — R600 Localized edema: Secondary | ICD-10-CM | POA: Diagnosis not present

## 2020-06-10 DIAGNOSIS — M47812 Spondylosis without myelopathy or radiculopathy, cervical region: Secondary | ICD-10-CM | POA: Diagnosis not present

## 2020-06-10 DIAGNOSIS — J90 Pleural effusion, not elsewhere classified: Secondary | ICD-10-CM | POA: Diagnosis not present

## 2020-06-10 DIAGNOSIS — K219 Gastro-esophageal reflux disease without esophagitis: Secondary | ICD-10-CM | POA: Diagnosis not present

## 2020-06-10 DIAGNOSIS — D62 Acute posthemorrhagic anemia: Secondary | ICD-10-CM | POA: Diagnosis not present

## 2020-06-10 DIAGNOSIS — D509 Iron deficiency anemia, unspecified: Secondary | ICD-10-CM | POA: Diagnosis not present

## 2020-06-10 DIAGNOSIS — M25551 Pain in right hip: Secondary | ICD-10-CM | POA: Diagnosis not present

## 2020-06-10 DIAGNOSIS — S79911A Unspecified injury of right hip, initial encounter: Secondary | ICD-10-CM | POA: Diagnosis not present

## 2020-06-10 DIAGNOSIS — R079 Chest pain, unspecified: Secondary | ICD-10-CM | POA: Diagnosis not present

## 2020-06-10 DIAGNOSIS — M1611 Unilateral primary osteoarthritis, right hip: Secondary | ICD-10-CM | POA: Diagnosis not present

## 2020-06-10 DIAGNOSIS — I482 Chronic atrial fibrillation, unspecified: Secondary | ICD-10-CM | POA: Diagnosis not present

## 2020-06-10 DIAGNOSIS — J9811 Atelectasis: Secondary | ICD-10-CM | POA: Diagnosis not present

## 2020-06-10 DIAGNOSIS — I5022 Chronic systolic (congestive) heart failure: Secondary | ICD-10-CM | POA: Diagnosis not present

## 2020-06-10 DIAGNOSIS — I708 Atherosclerosis of other arteries: Secondary | ICD-10-CM | POA: Diagnosis not present

## 2020-06-10 DIAGNOSIS — I2510 Atherosclerotic heart disease of native coronary artery without angina pectoris: Secondary | ICD-10-CM | POA: Diagnosis not present

## 2020-06-10 DIAGNOSIS — R296 Repeated falls: Secondary | ICD-10-CM | POA: Diagnosis not present

## 2020-06-10 DIAGNOSIS — R519 Headache, unspecified: Secondary | ICD-10-CM | POA: Diagnosis not present

## 2020-06-10 DIAGNOSIS — I509 Heart failure, unspecified: Secondary | ICD-10-CM | POA: Diagnosis not present

## 2020-06-11 DIAGNOSIS — R296 Repeated falls: Secondary | ICD-10-CM | POA: Diagnosis not present

## 2020-06-11 DIAGNOSIS — I482 Chronic atrial fibrillation, unspecified: Secondary | ICD-10-CM | POA: Diagnosis not present

## 2020-06-11 DIAGNOSIS — S7001XA Contusion of right hip, initial encounter: Secondary | ICD-10-CM | POA: Diagnosis not present

## 2020-06-12 ENCOUNTER — Telehealth: Payer: Self-pay | Admitting: Cardiovascular Disease

## 2020-06-12 DIAGNOSIS — I110 Hypertensive heart disease with heart failure: Secondary | ICD-10-CM | POA: Diagnosis not present

## 2020-06-12 DIAGNOSIS — S7001XD Contusion of right hip, subsequent encounter: Secondary | ICD-10-CM | POA: Diagnosis not present

## 2020-06-12 DIAGNOSIS — I4891 Unspecified atrial fibrillation: Secondary | ICD-10-CM | POA: Diagnosis not present

## 2020-06-12 DIAGNOSIS — I509 Heart failure, unspecified: Secondary | ICD-10-CM | POA: Diagnosis not present

## 2020-06-12 DIAGNOSIS — L89322 Pressure ulcer of left buttock, stage 2: Secondary | ICD-10-CM | POA: Diagnosis not present

## 2020-06-12 DIAGNOSIS — R296 Repeated falls: Secondary | ICD-10-CM | POA: Diagnosis not present

## 2020-06-12 DIAGNOSIS — E7800 Pure hypercholesterolemia, unspecified: Secondary | ICD-10-CM | POA: Diagnosis not present

## 2020-06-12 DIAGNOSIS — D649 Anemia, unspecified: Secondary | ICD-10-CM | POA: Diagnosis not present

## 2020-06-12 DIAGNOSIS — I2510 Atherosclerotic heart disease of native coronary artery without angina pectoris: Secondary | ICD-10-CM | POA: Diagnosis not present

## 2020-06-12 NOTE — Telephone Encounter (Signed)
Patient's daughter called in to say father was in ER over the weekend and the dr there took him off his blood thinner. The dr took him off because he kept falling and has a big bruise the dr was afraid of him bleeding out. The dr told him to setup and appt with his dr this week, but Dr Angelena Form doesn't have anything. Daughter also stated they gave him 2 pints of blood in ER. Please advise

## 2020-06-13 NOTE — Telephone Encounter (Signed)
Left message for patient's daughter to call back. 

## 2020-06-13 NOTE — Telephone Encounter (Signed)
I agree that he should stay off of Xarelto and follow up with primary care. Gerald Stabs

## 2020-06-13 NOTE — Telephone Encounter (Signed)
Kieth Brightly is returning Michalene's call. Please advise.

## 2020-06-13 NOTE — Telephone Encounter (Signed)
Had a fall on Wed last week w bruise hip down to knee.   Sat went to ER, hgb was 7, pt received 2 units of blood and Xarelto stopped and told to f/u with cardiology.  Doing ok at home.  Cannot hear on phone so his granddaughter is calling to make follow up appointment.    Pt seeing PCP this Friday.  They have talked to her already and they said she is in agreement to keep him off Xarelto but pt wants to hear it from cardiology.  Adv daughter that we can work to get him seen in our office but since there are no appointments for several weeks, we can send Dr. Camillia Herter recommendations to his PCP to review with the patient this Friday.

## 2020-06-15 DIAGNOSIS — Z6828 Body mass index (BMI) 28.0-28.9, adult: Secondary | ICD-10-CM | POA: Diagnosis not present

## 2020-06-15 DIAGNOSIS — S7001XD Contusion of right hip, subsequent encounter: Secondary | ICD-10-CM | POA: Diagnosis not present

## 2020-06-15 DIAGNOSIS — R609 Edema, unspecified: Secondary | ICD-10-CM | POA: Diagnosis not present

## 2020-06-15 DIAGNOSIS — D539 Nutritional anemia, unspecified: Secondary | ICD-10-CM | POA: Diagnosis not present

## 2020-06-15 DIAGNOSIS — I4819 Other persistent atrial fibrillation: Secondary | ICD-10-CM | POA: Diagnosis not present

## 2020-06-15 DIAGNOSIS — J449 Chronic obstructive pulmonary disease, unspecified: Secondary | ICD-10-CM | POA: Diagnosis not present

## 2020-06-15 DIAGNOSIS — I509 Heart failure, unspecified: Secondary | ICD-10-CM | POA: Diagnosis not present

## 2020-06-15 DIAGNOSIS — I10 Essential (primary) hypertension: Secondary | ICD-10-CM | POA: Diagnosis not present

## 2020-06-15 DIAGNOSIS — Z79899 Other long term (current) drug therapy: Secondary | ICD-10-CM | POA: Diagnosis not present

## 2020-06-18 DIAGNOSIS — I509 Heart failure, unspecified: Secondary | ICD-10-CM | POA: Diagnosis not present

## 2020-06-18 DIAGNOSIS — D649 Anemia, unspecified: Secondary | ICD-10-CM | POA: Diagnosis not present

## 2020-06-18 DIAGNOSIS — I2510 Atherosclerotic heart disease of native coronary artery without angina pectoris: Secondary | ICD-10-CM | POA: Diagnosis not present

## 2020-06-18 DIAGNOSIS — I4891 Unspecified atrial fibrillation: Secondary | ICD-10-CM | POA: Diagnosis not present

## 2020-06-18 DIAGNOSIS — I110 Hypertensive heart disease with heart failure: Secondary | ICD-10-CM | POA: Diagnosis not present

## 2020-06-18 DIAGNOSIS — S7001XD Contusion of right hip, subsequent encounter: Secondary | ICD-10-CM | POA: Diagnosis not present

## 2020-06-18 DIAGNOSIS — L89322 Pressure ulcer of left buttock, stage 2: Secondary | ICD-10-CM | POA: Diagnosis not present

## 2020-06-18 DIAGNOSIS — R296 Repeated falls: Secondary | ICD-10-CM | POA: Diagnosis not present

## 2020-06-18 DIAGNOSIS — E7800 Pure hypercholesterolemia, unspecified: Secondary | ICD-10-CM | POA: Diagnosis not present

## 2020-06-25 DIAGNOSIS — L89322 Pressure ulcer of left buttock, stage 2: Secondary | ICD-10-CM | POA: Diagnosis not present

## 2020-06-25 DIAGNOSIS — I110 Hypertensive heart disease with heart failure: Secondary | ICD-10-CM | POA: Diagnosis not present

## 2020-06-25 DIAGNOSIS — S7001XD Contusion of right hip, subsequent encounter: Secondary | ICD-10-CM | POA: Diagnosis not present

## 2020-06-25 DIAGNOSIS — E7800 Pure hypercholesterolemia, unspecified: Secondary | ICD-10-CM | POA: Diagnosis not present

## 2020-06-25 DIAGNOSIS — I4891 Unspecified atrial fibrillation: Secondary | ICD-10-CM | POA: Diagnosis not present

## 2020-06-25 DIAGNOSIS — R296 Repeated falls: Secondary | ICD-10-CM | POA: Diagnosis not present

## 2020-06-25 DIAGNOSIS — I2510 Atherosclerotic heart disease of native coronary artery without angina pectoris: Secondary | ICD-10-CM | POA: Diagnosis not present

## 2020-06-25 DIAGNOSIS — D649 Anemia, unspecified: Secondary | ICD-10-CM | POA: Diagnosis not present

## 2020-06-25 DIAGNOSIS — I509 Heart failure, unspecified: Secondary | ICD-10-CM | POA: Diagnosis not present

## 2020-07-02 DIAGNOSIS — R296 Repeated falls: Secondary | ICD-10-CM | POA: Diagnosis not present

## 2020-07-02 DIAGNOSIS — S7001XD Contusion of right hip, subsequent encounter: Secondary | ICD-10-CM | POA: Diagnosis not present

## 2020-07-02 DIAGNOSIS — I4891 Unspecified atrial fibrillation: Secondary | ICD-10-CM | POA: Diagnosis not present

## 2020-07-02 DIAGNOSIS — E7800 Pure hypercholesterolemia, unspecified: Secondary | ICD-10-CM | POA: Diagnosis not present

## 2020-07-02 DIAGNOSIS — L89322 Pressure ulcer of left buttock, stage 2: Secondary | ICD-10-CM | POA: Diagnosis not present

## 2020-07-02 DIAGNOSIS — I2510 Atherosclerotic heart disease of native coronary artery without angina pectoris: Secondary | ICD-10-CM | POA: Diagnosis not present

## 2020-07-02 DIAGNOSIS — D649 Anemia, unspecified: Secondary | ICD-10-CM | POA: Diagnosis not present

## 2020-07-02 DIAGNOSIS — I110 Hypertensive heart disease with heart failure: Secondary | ICD-10-CM | POA: Diagnosis not present

## 2020-07-02 DIAGNOSIS — I509 Heart failure, unspecified: Secondary | ICD-10-CM | POA: Diagnosis not present

## 2020-07-10 DIAGNOSIS — I2510 Atherosclerotic heart disease of native coronary artery without angina pectoris: Secondary | ICD-10-CM | POA: Diagnosis not present

## 2020-07-10 DIAGNOSIS — D649 Anemia, unspecified: Secondary | ICD-10-CM | POA: Diagnosis not present

## 2020-07-10 DIAGNOSIS — L89322 Pressure ulcer of left buttock, stage 2: Secondary | ICD-10-CM | POA: Diagnosis not present

## 2020-07-10 DIAGNOSIS — R296 Repeated falls: Secondary | ICD-10-CM | POA: Diagnosis not present

## 2020-07-10 DIAGNOSIS — S7001XD Contusion of right hip, subsequent encounter: Secondary | ICD-10-CM | POA: Diagnosis not present

## 2020-07-10 DIAGNOSIS — I4891 Unspecified atrial fibrillation: Secondary | ICD-10-CM | POA: Diagnosis not present

## 2020-07-10 DIAGNOSIS — I509 Heart failure, unspecified: Secondary | ICD-10-CM | POA: Diagnosis not present

## 2020-07-10 DIAGNOSIS — I110 Hypertensive heart disease with heart failure: Secondary | ICD-10-CM | POA: Diagnosis not present

## 2020-07-10 DIAGNOSIS — E7800 Pure hypercholesterolemia, unspecified: Secondary | ICD-10-CM | POA: Diagnosis not present

## 2020-07-12 DIAGNOSIS — I4891 Unspecified atrial fibrillation: Secondary | ICD-10-CM | POA: Diagnosis not present

## 2020-07-12 DIAGNOSIS — I509 Heart failure, unspecified: Secondary | ICD-10-CM | POA: Diagnosis not present

## 2020-07-12 DIAGNOSIS — E7800 Pure hypercholesterolemia, unspecified: Secondary | ICD-10-CM | POA: Diagnosis not present

## 2020-07-12 DIAGNOSIS — D649 Anemia, unspecified: Secondary | ICD-10-CM | POA: Diagnosis not present

## 2020-07-12 DIAGNOSIS — R296 Repeated falls: Secondary | ICD-10-CM | POA: Diagnosis not present

## 2020-07-12 DIAGNOSIS — S7001XD Contusion of right hip, subsequent encounter: Secondary | ICD-10-CM | POA: Diagnosis not present

## 2020-07-12 DIAGNOSIS — I110 Hypertensive heart disease with heart failure: Secondary | ICD-10-CM | POA: Diagnosis not present

## 2020-07-12 DIAGNOSIS — L89322 Pressure ulcer of left buttock, stage 2: Secondary | ICD-10-CM | POA: Diagnosis not present

## 2020-07-12 DIAGNOSIS — I2510 Atherosclerotic heart disease of native coronary artery without angina pectoris: Secondary | ICD-10-CM | POA: Diagnosis not present

## 2020-07-17 DIAGNOSIS — S7001XD Contusion of right hip, subsequent encounter: Secondary | ICD-10-CM | POA: Diagnosis not present

## 2020-07-17 DIAGNOSIS — I509 Heart failure, unspecified: Secondary | ICD-10-CM | POA: Diagnosis not present

## 2020-07-17 DIAGNOSIS — I2510 Atherosclerotic heart disease of native coronary artery without angina pectoris: Secondary | ICD-10-CM | POA: Diagnosis not present

## 2020-07-17 DIAGNOSIS — D649 Anemia, unspecified: Secondary | ICD-10-CM | POA: Diagnosis not present

## 2020-07-17 DIAGNOSIS — E7800 Pure hypercholesterolemia, unspecified: Secondary | ICD-10-CM | POA: Diagnosis not present

## 2020-07-17 DIAGNOSIS — I4891 Unspecified atrial fibrillation: Secondary | ICD-10-CM | POA: Diagnosis not present

## 2020-07-17 DIAGNOSIS — I110 Hypertensive heart disease with heart failure: Secondary | ICD-10-CM | POA: Diagnosis not present

## 2020-07-17 DIAGNOSIS — L89322 Pressure ulcer of left buttock, stage 2: Secondary | ICD-10-CM | POA: Diagnosis not present

## 2020-07-17 DIAGNOSIS — R296 Repeated falls: Secondary | ICD-10-CM | POA: Diagnosis not present

## 2020-07-23 DIAGNOSIS — D649 Anemia, unspecified: Secondary | ICD-10-CM | POA: Diagnosis not present

## 2020-07-23 DIAGNOSIS — L89322 Pressure ulcer of left buttock, stage 2: Secondary | ICD-10-CM | POA: Diagnosis not present

## 2020-07-23 DIAGNOSIS — I110 Hypertensive heart disease with heart failure: Secondary | ICD-10-CM | POA: Diagnosis not present

## 2020-07-23 DIAGNOSIS — R296 Repeated falls: Secondary | ICD-10-CM | POA: Diagnosis not present

## 2020-07-23 DIAGNOSIS — I2510 Atherosclerotic heart disease of native coronary artery without angina pectoris: Secondary | ICD-10-CM | POA: Diagnosis not present

## 2020-07-23 DIAGNOSIS — I509 Heart failure, unspecified: Secondary | ICD-10-CM | POA: Diagnosis not present

## 2020-07-23 DIAGNOSIS — S7001XD Contusion of right hip, subsequent encounter: Secondary | ICD-10-CM | POA: Diagnosis not present

## 2020-07-23 DIAGNOSIS — E7800 Pure hypercholesterolemia, unspecified: Secondary | ICD-10-CM | POA: Diagnosis not present

## 2020-07-23 DIAGNOSIS — I4891 Unspecified atrial fibrillation: Secondary | ICD-10-CM | POA: Diagnosis not present

## 2020-07-24 ENCOUNTER — Telehealth: Payer: Self-pay | Admitting: Cardiovascular Disease

## 2020-07-24 DIAGNOSIS — I110 Hypertensive heart disease with heart failure: Secondary | ICD-10-CM | POA: Diagnosis not present

## 2020-07-24 DIAGNOSIS — L89322 Pressure ulcer of left buttock, stage 2: Secondary | ICD-10-CM | POA: Diagnosis not present

## 2020-07-24 DIAGNOSIS — S7001XD Contusion of right hip, subsequent encounter: Secondary | ICD-10-CM | POA: Diagnosis not present

## 2020-07-24 DIAGNOSIS — I2510 Atherosclerotic heart disease of native coronary artery without angina pectoris: Secondary | ICD-10-CM | POA: Diagnosis not present

## 2020-07-24 DIAGNOSIS — I509 Heart failure, unspecified: Secondary | ICD-10-CM | POA: Diagnosis not present

## 2020-07-24 DIAGNOSIS — R296 Repeated falls: Secondary | ICD-10-CM | POA: Diagnosis not present

## 2020-07-24 DIAGNOSIS — I4891 Unspecified atrial fibrillation: Secondary | ICD-10-CM | POA: Diagnosis not present

## 2020-07-24 DIAGNOSIS — D649 Anemia, unspecified: Secondary | ICD-10-CM | POA: Diagnosis not present

## 2020-07-24 DIAGNOSIS — E7800 Pure hypercholesterolemia, unspecified: Secondary | ICD-10-CM | POA: Diagnosis not present

## 2020-07-24 NOTE — Telephone Encounter (Signed)
Patient has move with family and is closer to [place]. They would like to switch to a provider in [place]. Please advise

## 2020-07-30 DIAGNOSIS — I4891 Unspecified atrial fibrillation: Secondary | ICD-10-CM | POA: Diagnosis not present

## 2020-07-30 DIAGNOSIS — L89322 Pressure ulcer of left buttock, stage 2: Secondary | ICD-10-CM | POA: Diagnosis not present

## 2020-07-30 DIAGNOSIS — E7800 Pure hypercholesterolemia, unspecified: Secondary | ICD-10-CM | POA: Diagnosis not present

## 2020-07-30 DIAGNOSIS — R296 Repeated falls: Secondary | ICD-10-CM | POA: Diagnosis not present

## 2020-07-30 DIAGNOSIS — D649 Anemia, unspecified: Secondary | ICD-10-CM | POA: Diagnosis not present

## 2020-07-30 DIAGNOSIS — I110 Hypertensive heart disease with heart failure: Secondary | ICD-10-CM | POA: Diagnosis not present

## 2020-07-30 DIAGNOSIS — I2510 Atherosclerotic heart disease of native coronary artery without angina pectoris: Secondary | ICD-10-CM | POA: Diagnosis not present

## 2020-07-30 DIAGNOSIS — S7001XD Contusion of right hip, subsequent encounter: Secondary | ICD-10-CM | POA: Diagnosis not present

## 2020-07-30 DIAGNOSIS — I509 Heart failure, unspecified: Secondary | ICD-10-CM | POA: Diagnosis not present

## 2020-07-31 DIAGNOSIS — D539 Nutritional anemia, unspecified: Secondary | ICD-10-CM | POA: Diagnosis not present

## 2020-07-31 DIAGNOSIS — I4819 Other persistent atrial fibrillation: Secondary | ICD-10-CM | POA: Diagnosis not present

## 2020-07-31 DIAGNOSIS — E785 Hyperlipidemia, unspecified: Secondary | ICD-10-CM | POA: Diagnosis not present

## 2020-07-31 DIAGNOSIS — J449 Chronic obstructive pulmonary disease, unspecified: Secondary | ICD-10-CM | POA: Diagnosis not present

## 2020-07-31 DIAGNOSIS — R609 Edema, unspecified: Secondary | ICD-10-CM | POA: Diagnosis not present

## 2020-07-31 DIAGNOSIS — I10 Essential (primary) hypertension: Secondary | ICD-10-CM | POA: Diagnosis not present

## 2020-07-31 DIAGNOSIS — M47816 Spondylosis without myelopathy or radiculopathy, lumbar region: Secondary | ICD-10-CM | POA: Diagnosis not present

## 2020-07-31 DIAGNOSIS — Z6827 Body mass index (BMI) 27.0-27.9, adult: Secondary | ICD-10-CM | POA: Diagnosis not present

## 2020-08-07 DIAGNOSIS — L89322 Pressure ulcer of left buttock, stage 2: Secondary | ICD-10-CM | POA: Diagnosis not present

## 2020-08-07 DIAGNOSIS — C61 Malignant neoplasm of prostate: Secondary | ICD-10-CM | POA: Diagnosis not present

## 2020-08-07 DIAGNOSIS — I509 Heart failure, unspecified: Secondary | ICD-10-CM | POA: Diagnosis not present

## 2020-08-07 DIAGNOSIS — I2510 Atherosclerotic heart disease of native coronary artery without angina pectoris: Secondary | ICD-10-CM | POA: Diagnosis not present

## 2020-08-07 DIAGNOSIS — I4891 Unspecified atrial fibrillation: Secondary | ICD-10-CM | POA: Diagnosis not present

## 2020-08-07 DIAGNOSIS — R296 Repeated falls: Secondary | ICD-10-CM | POA: Diagnosis not present

## 2020-08-07 DIAGNOSIS — E7800 Pure hypercholesterolemia, unspecified: Secondary | ICD-10-CM | POA: Diagnosis not present

## 2020-08-07 DIAGNOSIS — R351 Nocturia: Secondary | ICD-10-CM | POA: Diagnosis not present

## 2020-08-07 DIAGNOSIS — D649 Anemia, unspecified: Secondary | ICD-10-CM | POA: Diagnosis not present

## 2020-08-07 DIAGNOSIS — S7001XD Contusion of right hip, subsequent encounter: Secondary | ICD-10-CM | POA: Diagnosis not present

## 2020-08-07 DIAGNOSIS — I110 Hypertensive heart disease with heart failure: Secondary | ICD-10-CM | POA: Diagnosis not present

## 2020-08-25 DIAGNOSIS — E7800 Pure hypercholesterolemia, unspecified: Secondary | ICD-10-CM | POA: Diagnosis not present

## 2020-08-25 DIAGNOSIS — F552 Abuse of laxatives: Secondary | ICD-10-CM | POA: Diagnosis not present

## 2020-08-25 DIAGNOSIS — R410 Disorientation, unspecified: Secondary | ICD-10-CM | POA: Diagnosis not present

## 2020-08-25 DIAGNOSIS — I10 Essential (primary) hypertension: Secondary | ICD-10-CM | POA: Diagnosis not present

## 2020-08-25 DIAGNOSIS — R109 Unspecified abdominal pain: Secondary | ICD-10-CM | POA: Diagnosis not present

## 2020-08-25 DIAGNOSIS — I252 Old myocardial infarction: Secondary | ICD-10-CM | POA: Diagnosis not present

## 2020-08-25 DIAGNOSIS — K219 Gastro-esophageal reflux disease without esophagitis: Secondary | ICD-10-CM | POA: Diagnosis not present

## 2020-08-25 DIAGNOSIS — R197 Diarrhea, unspecified: Secondary | ICD-10-CM | POA: Diagnosis not present

## 2020-08-25 DIAGNOSIS — J90 Pleural effusion, not elsewhere classified: Secondary | ICD-10-CM | POA: Diagnosis not present

## 2020-08-25 DIAGNOSIS — I517 Cardiomegaly: Secondary | ICD-10-CM | POA: Diagnosis not present

## 2020-08-25 DIAGNOSIS — D649 Anemia, unspecified: Secondary | ICD-10-CM | POA: Diagnosis not present

## 2020-08-25 DIAGNOSIS — I700 Atherosclerosis of aorta: Secondary | ICD-10-CM | POA: Diagnosis not present

## 2020-08-25 DIAGNOSIS — J9811 Atelectasis: Secondary | ICD-10-CM | POA: Diagnosis not present

## 2020-08-25 DIAGNOSIS — K529 Noninfective gastroenteritis and colitis, unspecified: Secondary | ICD-10-CM | POA: Diagnosis not present

## 2020-08-25 DIAGNOSIS — K8020 Calculus of gallbladder without cholecystitis without obstruction: Secondary | ICD-10-CM | POA: Diagnosis not present

## 2020-08-25 DIAGNOSIS — Z951 Presence of aortocoronary bypass graft: Secondary | ICD-10-CM | POA: Diagnosis not present

## 2020-09-13 DIAGNOSIS — E785 Hyperlipidemia, unspecified: Secondary | ICD-10-CM | POA: Diagnosis not present

## 2020-09-13 DIAGNOSIS — J80 Acute respiratory distress syndrome: Secondary | ICD-10-CM | POA: Diagnosis not present

## 2020-09-13 DIAGNOSIS — I482 Chronic atrial fibrillation, unspecified: Secondary | ICD-10-CM | POA: Diagnosis not present

## 2020-09-13 DIAGNOSIS — R400 Somnolence: Secondary | ICD-10-CM | POA: Diagnosis not present

## 2020-09-13 DIAGNOSIS — I361 Nonrheumatic tricuspid (valve) insufficiency: Secondary | ICD-10-CM | POA: Diagnosis not present

## 2020-09-13 DIAGNOSIS — I517 Cardiomegaly: Secondary | ICD-10-CM | POA: Diagnosis not present

## 2020-09-13 DIAGNOSIS — R0902 Hypoxemia: Secondary | ICD-10-CM | POA: Diagnosis not present

## 2020-09-13 DIAGNOSIS — R000 Tachycardia, unspecified: Secondary | ICD-10-CM | POA: Diagnosis not present

## 2020-09-13 DIAGNOSIS — R059 Cough, unspecified: Secondary | ICD-10-CM | POA: Diagnosis not present

## 2020-09-13 DIAGNOSIS — R531 Weakness: Secondary | ICD-10-CM | POA: Diagnosis not present

## 2020-09-13 DIAGNOSIS — J9601 Acute respiratory failure with hypoxia: Secondary | ICD-10-CM | POA: Diagnosis not present

## 2020-09-13 DIAGNOSIS — I2510 Atherosclerotic heart disease of native coronary artery without angina pectoris: Secondary | ICD-10-CM | POA: Diagnosis not present

## 2020-09-13 DIAGNOSIS — I499 Cardiac arrhythmia, unspecified: Secondary | ICD-10-CM | POA: Diagnosis not present

## 2020-09-13 DIAGNOSIS — L89159 Pressure ulcer of sacral region, unspecified stage: Secondary | ICD-10-CM | POA: Diagnosis not present

## 2020-09-13 DIAGNOSIS — U071 COVID-19: Secondary | ICD-10-CM | POA: Diagnosis not present

## 2020-09-13 DIAGNOSIS — I340 Nonrheumatic mitral (valve) insufficiency: Secondary | ICD-10-CM | POA: Diagnosis not present

## 2020-09-13 DIAGNOSIS — I110 Hypertensive heart disease with heart failure: Secondary | ICD-10-CM | POA: Diagnosis not present

## 2020-09-13 DIAGNOSIS — J9811 Atelectasis: Secondary | ICD-10-CM | POA: Diagnosis not present

## 2020-09-13 DIAGNOSIS — I350 Nonrheumatic aortic (valve) stenosis: Secondary | ICD-10-CM | POA: Diagnosis not present

## 2020-09-13 DIAGNOSIS — I5033 Acute on chronic diastolic (congestive) heart failure: Secondary | ICD-10-CM | POA: Diagnosis not present

## 2020-09-14 DIAGNOSIS — J9601 Acute respiratory failure with hypoxia: Secondary | ICD-10-CM | POA: Diagnosis not present

## 2020-09-14 DIAGNOSIS — U071 COVID-19: Secondary | ICD-10-CM | POA: Diagnosis not present

## 2020-09-14 DIAGNOSIS — I5033 Acute on chronic diastolic (congestive) heart failure: Secondary | ICD-10-CM | POA: Diagnosis not present

## 2020-09-15 DIAGNOSIS — J9601 Acute respiratory failure with hypoxia: Secondary | ICD-10-CM | POA: Diagnosis not present

## 2020-09-15 DIAGNOSIS — I5033 Acute on chronic diastolic (congestive) heart failure: Secondary | ICD-10-CM | POA: Diagnosis not present

## 2020-09-15 DIAGNOSIS — U071 COVID-19: Secondary | ICD-10-CM | POA: Diagnosis not present

## 2020-09-16 DIAGNOSIS — J9601 Acute respiratory failure with hypoxia: Secondary | ICD-10-CM | POA: Diagnosis not present

## 2020-09-16 DIAGNOSIS — I5033 Acute on chronic diastolic (congestive) heart failure: Secondary | ICD-10-CM | POA: Diagnosis not present

## 2020-09-16 DIAGNOSIS — U071 COVID-19: Secondary | ICD-10-CM | POA: Diagnosis not present

## 2020-09-17 DIAGNOSIS — I5033 Acute on chronic diastolic (congestive) heart failure: Secondary | ICD-10-CM | POA: Diagnosis not present

## 2020-09-17 DIAGNOSIS — U071 COVID-19: Secondary | ICD-10-CM | POA: Diagnosis not present

## 2020-09-17 DIAGNOSIS — J9601 Acute respiratory failure with hypoxia: Secondary | ICD-10-CM | POA: Diagnosis not present

## 2020-09-18 DIAGNOSIS — U071 COVID-19: Secondary | ICD-10-CM | POA: Diagnosis not present

## 2020-09-18 DIAGNOSIS — I5033 Acute on chronic diastolic (congestive) heart failure: Secondary | ICD-10-CM | POA: Diagnosis not present

## 2020-09-18 DIAGNOSIS — J9601 Acute respiratory failure with hypoxia: Secondary | ICD-10-CM | POA: Diagnosis not present

## 2020-09-19 DIAGNOSIS — U071 COVID-19: Secondary | ICD-10-CM | POA: Diagnosis not present

## 2020-09-19 DIAGNOSIS — I5033 Acute on chronic diastolic (congestive) heart failure: Secondary | ICD-10-CM | POA: Diagnosis not present

## 2020-09-19 DIAGNOSIS — J9601 Acute respiratory failure with hypoxia: Secondary | ICD-10-CM | POA: Diagnosis not present

## 2020-09-20 DIAGNOSIS — U071 COVID-19: Secondary | ICD-10-CM | POA: Diagnosis not present

## 2020-09-20 DIAGNOSIS — J9601 Acute respiratory failure with hypoxia: Secondary | ICD-10-CM | POA: Diagnosis not present

## 2020-09-20 DIAGNOSIS — I5033 Acute on chronic diastolic (congestive) heart failure: Secondary | ICD-10-CM | POA: Diagnosis not present

## 2020-09-21 DIAGNOSIS — K219 Gastro-esophageal reflux disease without esophagitis: Secondary | ICD-10-CM | POA: Diagnosis not present

## 2020-09-21 DIAGNOSIS — I2510 Atherosclerotic heart disease of native coronary artery without angina pectoris: Secondary | ICD-10-CM | POA: Diagnosis not present

## 2020-09-21 DIAGNOSIS — L89152 Pressure ulcer of sacral region, stage 2: Secondary | ICD-10-CM | POA: Diagnosis not present

## 2020-09-21 DIAGNOSIS — I482 Chronic atrial fibrillation, unspecified: Secondary | ICD-10-CM | POA: Diagnosis not present

## 2020-09-21 DIAGNOSIS — D649 Anemia, unspecified: Secondary | ICD-10-CM | POA: Diagnosis not present

## 2020-09-21 DIAGNOSIS — U071 COVID-19: Secondary | ICD-10-CM | POA: Diagnosis not present

## 2020-09-21 DIAGNOSIS — I5033 Acute on chronic diastolic (congestive) heart failure: Secondary | ICD-10-CM | POA: Diagnosis not present

## 2020-09-21 DIAGNOSIS — J449 Chronic obstructive pulmonary disease, unspecified: Secondary | ICD-10-CM | POA: Diagnosis not present

## 2020-09-21 DIAGNOSIS — I110 Hypertensive heart disease with heart failure: Secondary | ICD-10-CM | POA: Diagnosis not present

## 2020-09-24 DIAGNOSIS — D649 Anemia, unspecified: Secondary | ICD-10-CM | POA: Diagnosis not present

## 2020-09-24 DIAGNOSIS — I5033 Acute on chronic diastolic (congestive) heart failure: Secondary | ICD-10-CM | POA: Diagnosis not present

## 2020-09-24 DIAGNOSIS — I2510 Atherosclerotic heart disease of native coronary artery without angina pectoris: Secondary | ICD-10-CM | POA: Diagnosis not present

## 2020-09-24 DIAGNOSIS — U071 COVID-19: Secondary | ICD-10-CM | POA: Diagnosis not present

## 2020-09-24 DIAGNOSIS — I110 Hypertensive heart disease with heart failure: Secondary | ICD-10-CM | POA: Diagnosis not present

## 2020-09-24 DIAGNOSIS — J449 Chronic obstructive pulmonary disease, unspecified: Secondary | ICD-10-CM | POA: Diagnosis not present

## 2020-09-24 DIAGNOSIS — L89152 Pressure ulcer of sacral region, stage 2: Secondary | ICD-10-CM | POA: Diagnosis not present

## 2020-09-24 DIAGNOSIS — I482 Chronic atrial fibrillation, unspecified: Secondary | ICD-10-CM | POA: Diagnosis not present

## 2020-09-24 DIAGNOSIS — K219 Gastro-esophageal reflux disease without esophagitis: Secondary | ICD-10-CM | POA: Diagnosis not present

## 2020-09-25 DIAGNOSIS — J449 Chronic obstructive pulmonary disease, unspecified: Secondary | ICD-10-CM | POA: Diagnosis not present

## 2020-09-25 DIAGNOSIS — I110 Hypertensive heart disease with heart failure: Secondary | ICD-10-CM | POA: Diagnosis not present

## 2020-09-25 DIAGNOSIS — L89152 Pressure ulcer of sacral region, stage 2: Secondary | ICD-10-CM | POA: Diagnosis not present

## 2020-09-25 DIAGNOSIS — I5033 Acute on chronic diastolic (congestive) heart failure: Secondary | ICD-10-CM | POA: Diagnosis not present

## 2020-09-25 DIAGNOSIS — D649 Anemia, unspecified: Secondary | ICD-10-CM | POA: Diagnosis not present

## 2020-09-25 DIAGNOSIS — I2510 Atherosclerotic heart disease of native coronary artery without angina pectoris: Secondary | ICD-10-CM | POA: Diagnosis not present

## 2020-09-25 DIAGNOSIS — I482 Chronic atrial fibrillation, unspecified: Secondary | ICD-10-CM | POA: Diagnosis not present

## 2020-09-25 DIAGNOSIS — U071 COVID-19: Secondary | ICD-10-CM | POA: Diagnosis not present

## 2020-09-25 DIAGNOSIS — K219 Gastro-esophageal reflux disease without esophagitis: Secondary | ICD-10-CM | POA: Diagnosis not present

## 2020-09-27 DIAGNOSIS — I482 Chronic atrial fibrillation, unspecified: Secondary | ICD-10-CM | POA: Diagnosis not present

## 2020-09-27 DIAGNOSIS — U071 COVID-19: Secondary | ICD-10-CM | POA: Diagnosis not present

## 2020-09-27 DIAGNOSIS — I110 Hypertensive heart disease with heart failure: Secondary | ICD-10-CM | POA: Diagnosis not present

## 2020-09-27 DIAGNOSIS — D649 Anemia, unspecified: Secondary | ICD-10-CM | POA: Diagnosis not present

## 2020-09-27 DIAGNOSIS — I2510 Atherosclerotic heart disease of native coronary artery without angina pectoris: Secondary | ICD-10-CM | POA: Diagnosis not present

## 2020-09-27 DIAGNOSIS — J449 Chronic obstructive pulmonary disease, unspecified: Secondary | ICD-10-CM | POA: Diagnosis not present

## 2020-09-27 DIAGNOSIS — I5033 Acute on chronic diastolic (congestive) heart failure: Secondary | ICD-10-CM | POA: Diagnosis not present

## 2020-09-27 DIAGNOSIS — K219 Gastro-esophageal reflux disease without esophagitis: Secondary | ICD-10-CM | POA: Diagnosis not present

## 2020-09-27 DIAGNOSIS — L89152 Pressure ulcer of sacral region, stage 2: Secondary | ICD-10-CM | POA: Diagnosis not present

## 2020-10-01 DIAGNOSIS — U071 COVID-19: Secondary | ICD-10-CM | POA: Diagnosis not present

## 2020-10-01 DIAGNOSIS — I5033 Acute on chronic diastolic (congestive) heart failure: Secondary | ICD-10-CM | POA: Diagnosis not present

## 2020-10-01 DIAGNOSIS — L89152 Pressure ulcer of sacral region, stage 2: Secondary | ICD-10-CM | POA: Diagnosis not present

## 2020-10-01 DIAGNOSIS — I110 Hypertensive heart disease with heart failure: Secondary | ICD-10-CM | POA: Diagnosis not present

## 2020-10-01 DIAGNOSIS — K219 Gastro-esophageal reflux disease without esophagitis: Secondary | ICD-10-CM | POA: Diagnosis not present

## 2020-10-01 DIAGNOSIS — I482 Chronic atrial fibrillation, unspecified: Secondary | ICD-10-CM | POA: Diagnosis not present

## 2020-10-01 DIAGNOSIS — D649 Anemia, unspecified: Secondary | ICD-10-CM | POA: Diagnosis not present

## 2020-10-01 DIAGNOSIS — J449 Chronic obstructive pulmonary disease, unspecified: Secondary | ICD-10-CM | POA: Diagnosis not present

## 2020-10-01 DIAGNOSIS — I2510 Atherosclerotic heart disease of native coronary artery without angina pectoris: Secondary | ICD-10-CM | POA: Diagnosis not present

## 2020-10-02 DIAGNOSIS — I482 Chronic atrial fibrillation, unspecified: Secondary | ICD-10-CM | POA: Diagnosis not present

## 2020-10-02 DIAGNOSIS — I110 Hypertensive heart disease with heart failure: Secondary | ICD-10-CM | POA: Diagnosis not present

## 2020-10-02 DIAGNOSIS — I2510 Atherosclerotic heart disease of native coronary artery without angina pectoris: Secondary | ICD-10-CM | POA: Diagnosis not present

## 2020-10-02 DIAGNOSIS — U071 COVID-19: Secondary | ICD-10-CM | POA: Diagnosis not present

## 2020-10-02 DIAGNOSIS — K219 Gastro-esophageal reflux disease without esophagitis: Secondary | ICD-10-CM | POA: Diagnosis not present

## 2020-10-02 DIAGNOSIS — I5033 Acute on chronic diastolic (congestive) heart failure: Secondary | ICD-10-CM | POA: Diagnosis not present

## 2020-10-02 DIAGNOSIS — L89152 Pressure ulcer of sacral region, stage 2: Secondary | ICD-10-CM | POA: Diagnosis not present

## 2020-10-02 DIAGNOSIS — J449 Chronic obstructive pulmonary disease, unspecified: Secondary | ICD-10-CM | POA: Diagnosis not present

## 2020-10-02 DIAGNOSIS — D649 Anemia, unspecified: Secondary | ICD-10-CM | POA: Diagnosis not present

## 2020-10-04 DIAGNOSIS — I482 Chronic atrial fibrillation, unspecified: Secondary | ICD-10-CM | POA: Diagnosis not present

## 2020-10-04 DIAGNOSIS — I5033 Acute on chronic diastolic (congestive) heart failure: Secondary | ICD-10-CM | POA: Diagnosis not present

## 2020-10-04 DIAGNOSIS — Z6822 Body mass index (BMI) 22.0-22.9, adult: Secondary | ICD-10-CM | POA: Diagnosis not present

## 2020-10-04 DIAGNOSIS — J449 Chronic obstructive pulmonary disease, unspecified: Secondary | ICD-10-CM | POA: Diagnosis not present

## 2020-10-04 DIAGNOSIS — I4819 Other persistent atrial fibrillation: Secondary | ICD-10-CM | POA: Diagnosis not present

## 2020-10-04 DIAGNOSIS — L89152 Pressure ulcer of sacral region, stage 2: Secondary | ICD-10-CM | POA: Diagnosis not present

## 2020-10-04 DIAGNOSIS — K219 Gastro-esophageal reflux disease without esophagitis: Secondary | ICD-10-CM | POA: Diagnosis not present

## 2020-10-04 DIAGNOSIS — I2510 Atherosclerotic heart disease of native coronary artery without angina pectoris: Secondary | ICD-10-CM | POA: Diagnosis not present

## 2020-10-04 DIAGNOSIS — J9601 Acute respiratory failure with hypoxia: Secondary | ICD-10-CM | POA: Diagnosis not present

## 2020-10-04 DIAGNOSIS — I110 Hypertensive heart disease with heart failure: Secondary | ICD-10-CM | POA: Diagnosis not present

## 2020-10-04 DIAGNOSIS — I509 Heart failure, unspecified: Secondary | ICD-10-CM | POA: Diagnosis not present

## 2020-10-04 DIAGNOSIS — D649 Anemia, unspecified: Secondary | ICD-10-CM | POA: Diagnosis not present

## 2020-10-04 DIAGNOSIS — D539 Nutritional anemia, unspecified: Secondary | ICD-10-CM | POA: Diagnosis not present

## 2020-10-04 DIAGNOSIS — U071 COVID-19: Secondary | ICD-10-CM | POA: Diagnosis not present

## 2020-10-04 DIAGNOSIS — Z79899 Other long term (current) drug therapy: Secondary | ICD-10-CM | POA: Diagnosis not present

## 2020-10-04 DIAGNOSIS — R531 Weakness: Secondary | ICD-10-CM | POA: Diagnosis not present

## 2020-10-05 DIAGNOSIS — J449 Chronic obstructive pulmonary disease, unspecified: Secondary | ICD-10-CM | POA: Diagnosis not present

## 2020-10-05 DIAGNOSIS — I482 Chronic atrial fibrillation, unspecified: Secondary | ICD-10-CM | POA: Diagnosis not present

## 2020-10-05 DIAGNOSIS — L89152 Pressure ulcer of sacral region, stage 2: Secondary | ICD-10-CM | POA: Diagnosis not present

## 2020-10-05 DIAGNOSIS — I110 Hypertensive heart disease with heart failure: Secondary | ICD-10-CM | POA: Diagnosis not present

## 2020-10-05 DIAGNOSIS — I2510 Atherosclerotic heart disease of native coronary artery without angina pectoris: Secondary | ICD-10-CM | POA: Diagnosis not present

## 2020-10-05 DIAGNOSIS — I5033 Acute on chronic diastolic (congestive) heart failure: Secondary | ICD-10-CM | POA: Diagnosis not present

## 2020-10-05 DIAGNOSIS — K219 Gastro-esophageal reflux disease without esophagitis: Secondary | ICD-10-CM | POA: Diagnosis not present

## 2020-10-05 DIAGNOSIS — D649 Anemia, unspecified: Secondary | ICD-10-CM | POA: Diagnosis not present

## 2020-10-05 DIAGNOSIS — U071 COVID-19: Secondary | ICD-10-CM | POA: Diagnosis not present

## 2020-10-08 DIAGNOSIS — I5033 Acute on chronic diastolic (congestive) heart failure: Secondary | ICD-10-CM | POA: Diagnosis not present

## 2020-10-08 DIAGNOSIS — K219 Gastro-esophageal reflux disease without esophagitis: Secondary | ICD-10-CM | POA: Diagnosis not present

## 2020-10-08 DIAGNOSIS — I482 Chronic atrial fibrillation, unspecified: Secondary | ICD-10-CM | POA: Diagnosis not present

## 2020-10-08 DIAGNOSIS — U071 COVID-19: Secondary | ICD-10-CM | POA: Diagnosis not present

## 2020-10-08 DIAGNOSIS — L89152 Pressure ulcer of sacral region, stage 2: Secondary | ICD-10-CM | POA: Diagnosis not present

## 2020-10-08 DIAGNOSIS — D649 Anemia, unspecified: Secondary | ICD-10-CM | POA: Diagnosis not present

## 2020-10-08 DIAGNOSIS — I110 Hypertensive heart disease with heart failure: Secondary | ICD-10-CM | POA: Diagnosis not present

## 2020-10-08 DIAGNOSIS — I2510 Atherosclerotic heart disease of native coronary artery without angina pectoris: Secondary | ICD-10-CM | POA: Diagnosis not present

## 2020-10-08 DIAGNOSIS — J449 Chronic obstructive pulmonary disease, unspecified: Secondary | ICD-10-CM | POA: Diagnosis not present

## 2020-10-09 DIAGNOSIS — I5033 Acute on chronic diastolic (congestive) heart failure: Secondary | ICD-10-CM | POA: Diagnosis not present

## 2020-10-09 DIAGNOSIS — L89152 Pressure ulcer of sacral region, stage 2: Secondary | ICD-10-CM | POA: Diagnosis not present

## 2020-10-09 DIAGNOSIS — I482 Chronic atrial fibrillation, unspecified: Secondary | ICD-10-CM | POA: Diagnosis not present

## 2020-10-09 DIAGNOSIS — K219 Gastro-esophageal reflux disease without esophagitis: Secondary | ICD-10-CM | POA: Diagnosis not present

## 2020-10-09 DIAGNOSIS — I110 Hypertensive heart disease with heart failure: Secondary | ICD-10-CM | POA: Diagnosis not present

## 2020-10-09 DIAGNOSIS — I2510 Atherosclerotic heart disease of native coronary artery without angina pectoris: Secondary | ICD-10-CM | POA: Diagnosis not present

## 2020-10-09 DIAGNOSIS — U071 COVID-19: Secondary | ICD-10-CM | POA: Diagnosis not present

## 2020-10-09 DIAGNOSIS — J449 Chronic obstructive pulmonary disease, unspecified: Secondary | ICD-10-CM | POA: Diagnosis not present

## 2020-10-09 DIAGNOSIS — D649 Anemia, unspecified: Secondary | ICD-10-CM | POA: Diagnosis not present

## 2020-10-10 DIAGNOSIS — I2510 Atherosclerotic heart disease of native coronary artery without angina pectoris: Secondary | ICD-10-CM | POA: Diagnosis not present

## 2020-10-10 DIAGNOSIS — K219 Gastro-esophageal reflux disease without esophagitis: Secondary | ICD-10-CM | POA: Diagnosis not present

## 2020-10-10 DIAGNOSIS — J449 Chronic obstructive pulmonary disease, unspecified: Secondary | ICD-10-CM | POA: Diagnosis not present

## 2020-10-10 DIAGNOSIS — I482 Chronic atrial fibrillation, unspecified: Secondary | ICD-10-CM | POA: Diagnosis not present

## 2020-10-10 DIAGNOSIS — I5033 Acute on chronic diastolic (congestive) heart failure: Secondary | ICD-10-CM | POA: Diagnosis not present

## 2020-10-10 DIAGNOSIS — I110 Hypertensive heart disease with heart failure: Secondary | ICD-10-CM | POA: Diagnosis not present

## 2020-10-10 DIAGNOSIS — L89152 Pressure ulcer of sacral region, stage 2: Secondary | ICD-10-CM | POA: Diagnosis not present

## 2020-10-10 DIAGNOSIS — D649 Anemia, unspecified: Secondary | ICD-10-CM | POA: Diagnosis not present

## 2020-10-10 DIAGNOSIS — U071 COVID-19: Secondary | ICD-10-CM | POA: Diagnosis not present

## 2020-10-11 DIAGNOSIS — I482 Chronic atrial fibrillation, unspecified: Secondary | ICD-10-CM | POA: Diagnosis not present

## 2020-10-11 DIAGNOSIS — I110 Hypertensive heart disease with heart failure: Secondary | ICD-10-CM | POA: Diagnosis not present

## 2020-10-11 DIAGNOSIS — U071 COVID-19: Secondary | ICD-10-CM | POA: Diagnosis not present

## 2020-10-11 DIAGNOSIS — I5033 Acute on chronic diastolic (congestive) heart failure: Secondary | ICD-10-CM | POA: Diagnosis not present

## 2020-10-11 DIAGNOSIS — K219 Gastro-esophageal reflux disease without esophagitis: Secondary | ICD-10-CM | POA: Diagnosis not present

## 2020-10-11 DIAGNOSIS — I2510 Atherosclerotic heart disease of native coronary artery without angina pectoris: Secondary | ICD-10-CM | POA: Diagnosis not present

## 2020-10-11 DIAGNOSIS — D649 Anemia, unspecified: Secondary | ICD-10-CM | POA: Diagnosis not present

## 2020-10-11 DIAGNOSIS — J449 Chronic obstructive pulmonary disease, unspecified: Secondary | ICD-10-CM | POA: Diagnosis not present

## 2020-10-11 DIAGNOSIS — L89152 Pressure ulcer of sacral region, stage 2: Secondary | ICD-10-CM | POA: Diagnosis not present

## 2020-10-15 DIAGNOSIS — J449 Chronic obstructive pulmonary disease, unspecified: Secondary | ICD-10-CM | POA: Diagnosis not present

## 2020-10-15 DIAGNOSIS — D649 Anemia, unspecified: Secondary | ICD-10-CM | POA: Diagnosis not present

## 2020-10-15 DIAGNOSIS — L89152 Pressure ulcer of sacral region, stage 2: Secondary | ICD-10-CM | POA: Diagnosis not present

## 2020-10-15 DIAGNOSIS — U071 COVID-19: Secondary | ICD-10-CM | POA: Diagnosis not present

## 2020-10-15 DIAGNOSIS — I5033 Acute on chronic diastolic (congestive) heart failure: Secondary | ICD-10-CM | POA: Diagnosis not present

## 2020-10-15 DIAGNOSIS — K219 Gastro-esophageal reflux disease without esophagitis: Secondary | ICD-10-CM | POA: Diagnosis not present

## 2020-10-15 DIAGNOSIS — I110 Hypertensive heart disease with heart failure: Secondary | ICD-10-CM | POA: Diagnosis not present

## 2020-10-15 DIAGNOSIS — I482 Chronic atrial fibrillation, unspecified: Secondary | ICD-10-CM | POA: Diagnosis not present

## 2020-10-15 DIAGNOSIS — I2510 Atherosclerotic heart disease of native coronary artery without angina pectoris: Secondary | ICD-10-CM | POA: Diagnosis not present

## 2020-10-16 DIAGNOSIS — K219 Gastro-esophageal reflux disease without esophagitis: Secondary | ICD-10-CM | POA: Diagnosis not present

## 2020-10-16 DIAGNOSIS — D649 Anemia, unspecified: Secondary | ICD-10-CM | POA: Diagnosis not present

## 2020-10-16 DIAGNOSIS — I110 Hypertensive heart disease with heart failure: Secondary | ICD-10-CM | POA: Diagnosis not present

## 2020-10-16 DIAGNOSIS — I2510 Atherosclerotic heart disease of native coronary artery without angina pectoris: Secondary | ICD-10-CM | POA: Diagnosis not present

## 2020-10-16 DIAGNOSIS — I482 Chronic atrial fibrillation, unspecified: Secondary | ICD-10-CM | POA: Diagnosis not present

## 2020-10-16 DIAGNOSIS — U071 COVID-19: Secondary | ICD-10-CM | POA: Diagnosis not present

## 2020-10-16 DIAGNOSIS — L89152 Pressure ulcer of sacral region, stage 2: Secondary | ICD-10-CM | POA: Diagnosis not present

## 2020-10-16 DIAGNOSIS — I5033 Acute on chronic diastolic (congestive) heart failure: Secondary | ICD-10-CM | POA: Diagnosis not present

## 2020-10-16 DIAGNOSIS — J449 Chronic obstructive pulmonary disease, unspecified: Secondary | ICD-10-CM | POA: Diagnosis not present

## 2020-10-19 DIAGNOSIS — L89152 Pressure ulcer of sacral region, stage 2: Secondary | ICD-10-CM | POA: Diagnosis not present

## 2020-10-19 DIAGNOSIS — K219 Gastro-esophageal reflux disease without esophagitis: Secondary | ICD-10-CM | POA: Diagnosis not present

## 2020-10-19 DIAGNOSIS — I482 Chronic atrial fibrillation, unspecified: Secondary | ICD-10-CM | POA: Diagnosis not present

## 2020-10-19 DIAGNOSIS — J449 Chronic obstructive pulmonary disease, unspecified: Secondary | ICD-10-CM | POA: Diagnosis not present

## 2020-10-19 DIAGNOSIS — I110 Hypertensive heart disease with heart failure: Secondary | ICD-10-CM | POA: Diagnosis not present

## 2020-10-19 DIAGNOSIS — U071 COVID-19: Secondary | ICD-10-CM | POA: Diagnosis not present

## 2020-10-19 DIAGNOSIS — I2510 Atherosclerotic heart disease of native coronary artery without angina pectoris: Secondary | ICD-10-CM | POA: Diagnosis not present

## 2020-10-19 DIAGNOSIS — I5033 Acute on chronic diastolic (congestive) heart failure: Secondary | ICD-10-CM | POA: Diagnosis not present

## 2020-10-19 DIAGNOSIS — D649 Anemia, unspecified: Secondary | ICD-10-CM | POA: Diagnosis not present

## 2020-10-20 DIAGNOSIS — U071 COVID-19: Secondary | ICD-10-CM | POA: Diagnosis not present

## 2020-10-21 DIAGNOSIS — K219 Gastro-esophageal reflux disease without esophagitis: Secondary | ICD-10-CM | POA: Diagnosis not present

## 2020-10-21 DIAGNOSIS — U071 COVID-19: Secondary | ICD-10-CM | POA: Diagnosis not present

## 2020-10-21 DIAGNOSIS — L89152 Pressure ulcer of sacral region, stage 2: Secondary | ICD-10-CM | POA: Diagnosis not present

## 2020-10-21 DIAGNOSIS — I5033 Acute on chronic diastolic (congestive) heart failure: Secondary | ICD-10-CM | POA: Diagnosis not present

## 2020-10-21 DIAGNOSIS — J449 Chronic obstructive pulmonary disease, unspecified: Secondary | ICD-10-CM | POA: Diagnosis not present

## 2020-10-21 DIAGNOSIS — I482 Chronic atrial fibrillation, unspecified: Secondary | ICD-10-CM | POA: Diagnosis not present

## 2020-10-21 DIAGNOSIS — I2510 Atherosclerotic heart disease of native coronary artery without angina pectoris: Secondary | ICD-10-CM | POA: Diagnosis not present

## 2020-10-21 DIAGNOSIS — D649 Anemia, unspecified: Secondary | ICD-10-CM | POA: Diagnosis not present

## 2020-10-21 DIAGNOSIS — I110 Hypertensive heart disease with heart failure: Secondary | ICD-10-CM | POA: Diagnosis not present

## 2020-10-22 DIAGNOSIS — I482 Chronic atrial fibrillation, unspecified: Secondary | ICD-10-CM | POA: Diagnosis not present

## 2020-10-22 DIAGNOSIS — I110 Hypertensive heart disease with heart failure: Secondary | ICD-10-CM | POA: Diagnosis not present

## 2020-10-22 DIAGNOSIS — L89152 Pressure ulcer of sacral region, stage 2: Secondary | ICD-10-CM | POA: Diagnosis not present

## 2020-10-22 DIAGNOSIS — D649 Anemia, unspecified: Secondary | ICD-10-CM | POA: Diagnosis not present

## 2020-10-22 DIAGNOSIS — K219 Gastro-esophageal reflux disease without esophagitis: Secondary | ICD-10-CM | POA: Diagnosis not present

## 2020-10-22 DIAGNOSIS — I2510 Atherosclerotic heart disease of native coronary artery without angina pectoris: Secondary | ICD-10-CM | POA: Diagnosis not present

## 2020-10-22 DIAGNOSIS — I5033 Acute on chronic diastolic (congestive) heart failure: Secondary | ICD-10-CM | POA: Diagnosis not present

## 2020-10-22 DIAGNOSIS — U071 COVID-19: Secondary | ICD-10-CM | POA: Diagnosis not present

## 2020-10-22 DIAGNOSIS — J449 Chronic obstructive pulmonary disease, unspecified: Secondary | ICD-10-CM | POA: Diagnosis not present

## 2020-10-23 DIAGNOSIS — U071 COVID-19: Secondary | ICD-10-CM | POA: Diagnosis not present

## 2020-10-23 DIAGNOSIS — I5033 Acute on chronic diastolic (congestive) heart failure: Secondary | ICD-10-CM | POA: Diagnosis not present

## 2020-10-23 DIAGNOSIS — I110 Hypertensive heart disease with heart failure: Secondary | ICD-10-CM | POA: Diagnosis not present

## 2020-10-23 DIAGNOSIS — D649 Anemia, unspecified: Secondary | ICD-10-CM | POA: Diagnosis not present

## 2020-10-23 DIAGNOSIS — I482 Chronic atrial fibrillation, unspecified: Secondary | ICD-10-CM | POA: Diagnosis not present

## 2020-10-23 DIAGNOSIS — I2510 Atherosclerotic heart disease of native coronary artery without angina pectoris: Secondary | ICD-10-CM | POA: Diagnosis not present

## 2020-10-23 DIAGNOSIS — L89152 Pressure ulcer of sacral region, stage 2: Secondary | ICD-10-CM | POA: Diagnosis not present

## 2020-10-23 DIAGNOSIS — K219 Gastro-esophageal reflux disease without esophagitis: Secondary | ICD-10-CM | POA: Diagnosis not present

## 2020-10-23 DIAGNOSIS — J449 Chronic obstructive pulmonary disease, unspecified: Secondary | ICD-10-CM | POA: Diagnosis not present

## 2020-10-26 DIAGNOSIS — J449 Chronic obstructive pulmonary disease, unspecified: Secondary | ICD-10-CM | POA: Diagnosis not present

## 2020-10-26 DIAGNOSIS — I110 Hypertensive heart disease with heart failure: Secondary | ICD-10-CM | POA: Diagnosis not present

## 2020-10-26 DIAGNOSIS — D649 Anemia, unspecified: Secondary | ICD-10-CM | POA: Diagnosis not present

## 2020-10-26 DIAGNOSIS — U071 COVID-19: Secondary | ICD-10-CM | POA: Diagnosis not present

## 2020-10-26 DIAGNOSIS — K219 Gastro-esophageal reflux disease without esophagitis: Secondary | ICD-10-CM | POA: Diagnosis not present

## 2020-10-26 DIAGNOSIS — I482 Chronic atrial fibrillation, unspecified: Secondary | ICD-10-CM | POA: Diagnosis not present

## 2020-10-26 DIAGNOSIS — I5033 Acute on chronic diastolic (congestive) heart failure: Secondary | ICD-10-CM | POA: Diagnosis not present

## 2020-10-26 DIAGNOSIS — L89152 Pressure ulcer of sacral region, stage 2: Secondary | ICD-10-CM | POA: Diagnosis not present

## 2020-10-26 DIAGNOSIS — I2510 Atherosclerotic heart disease of native coronary artery without angina pectoris: Secondary | ICD-10-CM | POA: Diagnosis not present

## 2020-10-29 DIAGNOSIS — I482 Chronic atrial fibrillation, unspecified: Secondary | ICD-10-CM | POA: Diagnosis not present

## 2020-10-29 DIAGNOSIS — I5033 Acute on chronic diastolic (congestive) heart failure: Secondary | ICD-10-CM | POA: Diagnosis not present

## 2020-10-29 DIAGNOSIS — I2510 Atherosclerotic heart disease of native coronary artery without angina pectoris: Secondary | ICD-10-CM | POA: Diagnosis not present

## 2020-10-29 DIAGNOSIS — D649 Anemia, unspecified: Secondary | ICD-10-CM | POA: Diagnosis not present

## 2020-10-29 DIAGNOSIS — J449 Chronic obstructive pulmonary disease, unspecified: Secondary | ICD-10-CM | POA: Diagnosis not present

## 2020-10-29 DIAGNOSIS — L89152 Pressure ulcer of sacral region, stage 2: Secondary | ICD-10-CM | POA: Diagnosis not present

## 2020-10-29 DIAGNOSIS — U071 COVID-19: Secondary | ICD-10-CM | POA: Diagnosis not present

## 2020-10-29 DIAGNOSIS — I110 Hypertensive heart disease with heart failure: Secondary | ICD-10-CM | POA: Diagnosis not present

## 2020-10-29 DIAGNOSIS — K219 Gastro-esophageal reflux disease without esophagitis: Secondary | ICD-10-CM | POA: Diagnosis not present

## 2020-10-31 DIAGNOSIS — I2510 Atherosclerotic heart disease of native coronary artery without angina pectoris: Secondary | ICD-10-CM | POA: Diagnosis not present

## 2020-10-31 DIAGNOSIS — I482 Chronic atrial fibrillation, unspecified: Secondary | ICD-10-CM | POA: Diagnosis not present

## 2020-10-31 DIAGNOSIS — I110 Hypertensive heart disease with heart failure: Secondary | ICD-10-CM | POA: Diagnosis not present

## 2020-10-31 DIAGNOSIS — J449 Chronic obstructive pulmonary disease, unspecified: Secondary | ICD-10-CM | POA: Diagnosis not present

## 2020-10-31 DIAGNOSIS — K219 Gastro-esophageal reflux disease without esophagitis: Secondary | ICD-10-CM | POA: Diagnosis not present

## 2020-10-31 DIAGNOSIS — I5033 Acute on chronic diastolic (congestive) heart failure: Secondary | ICD-10-CM | POA: Diagnosis not present

## 2020-10-31 DIAGNOSIS — L89152 Pressure ulcer of sacral region, stage 2: Secondary | ICD-10-CM | POA: Diagnosis not present

## 2020-10-31 DIAGNOSIS — U071 COVID-19: Secondary | ICD-10-CM | POA: Diagnosis not present

## 2020-10-31 DIAGNOSIS — D649 Anemia, unspecified: Secondary | ICD-10-CM | POA: Diagnosis not present

## 2020-11-01 DIAGNOSIS — I5033 Acute on chronic diastolic (congestive) heart failure: Secondary | ICD-10-CM | POA: Diagnosis not present

## 2020-11-01 DIAGNOSIS — I110 Hypertensive heart disease with heart failure: Secondary | ICD-10-CM | POA: Diagnosis not present

## 2020-11-01 DIAGNOSIS — L89152 Pressure ulcer of sacral region, stage 2: Secondary | ICD-10-CM | POA: Diagnosis not present

## 2020-11-01 DIAGNOSIS — U071 COVID-19: Secondary | ICD-10-CM | POA: Diagnosis not present

## 2020-11-01 DIAGNOSIS — D649 Anemia, unspecified: Secondary | ICD-10-CM | POA: Diagnosis not present

## 2020-11-01 DIAGNOSIS — I2510 Atherosclerotic heart disease of native coronary artery without angina pectoris: Secondary | ICD-10-CM | POA: Diagnosis not present

## 2020-11-01 DIAGNOSIS — I482 Chronic atrial fibrillation, unspecified: Secondary | ICD-10-CM | POA: Diagnosis not present

## 2020-11-01 DIAGNOSIS — J449 Chronic obstructive pulmonary disease, unspecified: Secondary | ICD-10-CM | POA: Diagnosis not present

## 2020-11-01 DIAGNOSIS — K219 Gastro-esophageal reflux disease without esophagitis: Secondary | ICD-10-CM | POA: Diagnosis not present

## 2020-11-05 DIAGNOSIS — J449 Chronic obstructive pulmonary disease, unspecified: Secondary | ICD-10-CM | POA: Diagnosis not present

## 2020-11-05 DIAGNOSIS — I110 Hypertensive heart disease with heart failure: Secondary | ICD-10-CM | POA: Diagnosis not present

## 2020-11-05 DIAGNOSIS — I482 Chronic atrial fibrillation, unspecified: Secondary | ICD-10-CM | POA: Diagnosis not present

## 2020-11-05 DIAGNOSIS — D649 Anemia, unspecified: Secondary | ICD-10-CM | POA: Diagnosis not present

## 2020-11-05 DIAGNOSIS — I2510 Atherosclerotic heart disease of native coronary artery without angina pectoris: Secondary | ICD-10-CM | POA: Diagnosis not present

## 2020-11-05 DIAGNOSIS — L89152 Pressure ulcer of sacral region, stage 2: Secondary | ICD-10-CM | POA: Diagnosis not present

## 2020-11-05 DIAGNOSIS — K219 Gastro-esophageal reflux disease without esophagitis: Secondary | ICD-10-CM | POA: Diagnosis not present

## 2020-11-05 DIAGNOSIS — I5033 Acute on chronic diastolic (congestive) heart failure: Secondary | ICD-10-CM | POA: Diagnosis not present

## 2020-11-05 DIAGNOSIS — U071 COVID-19: Secondary | ICD-10-CM | POA: Diagnosis not present

## 2020-11-07 DIAGNOSIS — D649 Anemia, unspecified: Secondary | ICD-10-CM | POA: Diagnosis not present

## 2020-11-07 DIAGNOSIS — I5033 Acute on chronic diastolic (congestive) heart failure: Secondary | ICD-10-CM | POA: Diagnosis not present

## 2020-11-07 DIAGNOSIS — K219 Gastro-esophageal reflux disease without esophagitis: Secondary | ICD-10-CM | POA: Diagnosis not present

## 2020-11-07 DIAGNOSIS — U071 COVID-19: Secondary | ICD-10-CM | POA: Diagnosis not present

## 2020-11-07 DIAGNOSIS — I110 Hypertensive heart disease with heart failure: Secondary | ICD-10-CM | POA: Diagnosis not present

## 2020-11-07 DIAGNOSIS — I2510 Atherosclerotic heart disease of native coronary artery without angina pectoris: Secondary | ICD-10-CM | POA: Diagnosis not present

## 2020-11-07 DIAGNOSIS — I482 Chronic atrial fibrillation, unspecified: Secondary | ICD-10-CM | POA: Diagnosis not present

## 2020-11-07 DIAGNOSIS — L89152 Pressure ulcer of sacral region, stage 2: Secondary | ICD-10-CM | POA: Diagnosis not present

## 2020-11-07 DIAGNOSIS — J449 Chronic obstructive pulmonary disease, unspecified: Secondary | ICD-10-CM | POA: Diagnosis not present

## 2020-11-08 DIAGNOSIS — I110 Hypertensive heart disease with heart failure: Secondary | ICD-10-CM | POA: Diagnosis not present

## 2020-11-08 DIAGNOSIS — U071 COVID-19: Secondary | ICD-10-CM | POA: Diagnosis not present

## 2020-11-08 DIAGNOSIS — J449 Chronic obstructive pulmonary disease, unspecified: Secondary | ICD-10-CM | POA: Diagnosis not present

## 2020-11-08 DIAGNOSIS — D649 Anemia, unspecified: Secondary | ICD-10-CM | POA: Diagnosis not present

## 2020-11-08 DIAGNOSIS — I2510 Atherosclerotic heart disease of native coronary artery without angina pectoris: Secondary | ICD-10-CM | POA: Diagnosis not present

## 2020-11-08 DIAGNOSIS — I5033 Acute on chronic diastolic (congestive) heart failure: Secondary | ICD-10-CM | POA: Diagnosis not present

## 2020-11-08 DIAGNOSIS — L89152 Pressure ulcer of sacral region, stage 2: Secondary | ICD-10-CM | POA: Diagnosis not present

## 2020-11-08 DIAGNOSIS — I482 Chronic atrial fibrillation, unspecified: Secondary | ICD-10-CM | POA: Diagnosis not present

## 2020-11-08 DIAGNOSIS — K219 Gastro-esophageal reflux disease without esophagitis: Secondary | ICD-10-CM | POA: Diagnosis not present

## 2020-11-12 DIAGNOSIS — U071 COVID-19: Secondary | ICD-10-CM | POA: Diagnosis not present

## 2020-11-12 DIAGNOSIS — L89152 Pressure ulcer of sacral region, stage 2: Secondary | ICD-10-CM | POA: Diagnosis not present

## 2020-11-12 DIAGNOSIS — I2510 Atherosclerotic heart disease of native coronary artery without angina pectoris: Secondary | ICD-10-CM | POA: Diagnosis not present

## 2020-11-12 DIAGNOSIS — J449 Chronic obstructive pulmonary disease, unspecified: Secondary | ICD-10-CM | POA: Diagnosis not present

## 2020-11-12 DIAGNOSIS — I110 Hypertensive heart disease with heart failure: Secondary | ICD-10-CM | POA: Diagnosis not present

## 2020-11-12 DIAGNOSIS — I5033 Acute on chronic diastolic (congestive) heart failure: Secondary | ICD-10-CM | POA: Diagnosis not present

## 2020-11-12 DIAGNOSIS — K219 Gastro-esophageal reflux disease without esophagitis: Secondary | ICD-10-CM | POA: Diagnosis not present

## 2020-11-12 DIAGNOSIS — D649 Anemia, unspecified: Secondary | ICD-10-CM | POA: Diagnosis not present

## 2020-11-12 DIAGNOSIS — I482 Chronic atrial fibrillation, unspecified: Secondary | ICD-10-CM | POA: Diagnosis not present

## 2020-11-14 DIAGNOSIS — J449 Chronic obstructive pulmonary disease, unspecified: Secondary | ICD-10-CM | POA: Diagnosis not present

## 2020-11-14 DIAGNOSIS — U071 COVID-19: Secondary | ICD-10-CM | POA: Diagnosis not present

## 2020-11-14 DIAGNOSIS — I110 Hypertensive heart disease with heart failure: Secondary | ICD-10-CM | POA: Diagnosis not present

## 2020-11-14 DIAGNOSIS — I2510 Atherosclerotic heart disease of native coronary artery without angina pectoris: Secondary | ICD-10-CM | POA: Diagnosis not present

## 2020-11-14 DIAGNOSIS — D649 Anemia, unspecified: Secondary | ICD-10-CM | POA: Diagnosis not present

## 2020-11-14 DIAGNOSIS — K219 Gastro-esophageal reflux disease without esophagitis: Secondary | ICD-10-CM | POA: Diagnosis not present

## 2020-11-14 DIAGNOSIS — I482 Chronic atrial fibrillation, unspecified: Secondary | ICD-10-CM | POA: Diagnosis not present

## 2020-11-14 DIAGNOSIS — I5033 Acute on chronic diastolic (congestive) heart failure: Secondary | ICD-10-CM | POA: Diagnosis not present

## 2020-11-14 DIAGNOSIS — L89152 Pressure ulcer of sacral region, stage 2: Secondary | ICD-10-CM | POA: Diagnosis not present

## 2020-11-19 DIAGNOSIS — J449 Chronic obstructive pulmonary disease, unspecified: Secondary | ICD-10-CM | POA: Diagnosis not present

## 2020-11-19 DIAGNOSIS — I110 Hypertensive heart disease with heart failure: Secondary | ICD-10-CM | POA: Diagnosis not present

## 2020-11-19 DIAGNOSIS — K219 Gastro-esophageal reflux disease without esophagitis: Secondary | ICD-10-CM | POA: Diagnosis not present

## 2020-11-19 DIAGNOSIS — I2510 Atherosclerotic heart disease of native coronary artery without angina pectoris: Secondary | ICD-10-CM | POA: Diagnosis not present

## 2020-11-19 DIAGNOSIS — I5033 Acute on chronic diastolic (congestive) heart failure: Secondary | ICD-10-CM | POA: Diagnosis not present

## 2020-11-19 DIAGNOSIS — L89152 Pressure ulcer of sacral region, stage 2: Secondary | ICD-10-CM | POA: Diagnosis not present

## 2020-11-19 DIAGNOSIS — U071 COVID-19: Secondary | ICD-10-CM | POA: Diagnosis not present

## 2020-11-19 DIAGNOSIS — D649 Anemia, unspecified: Secondary | ICD-10-CM | POA: Diagnosis not present

## 2020-11-19 DIAGNOSIS — I482 Chronic atrial fibrillation, unspecified: Secondary | ICD-10-CM | POA: Diagnosis not present

## 2020-11-20 DIAGNOSIS — I482 Chronic atrial fibrillation, unspecified: Secondary | ICD-10-CM | POA: Diagnosis not present

## 2020-11-20 DIAGNOSIS — U071 COVID-19: Secondary | ICD-10-CM | POA: Diagnosis not present

## 2020-11-20 DIAGNOSIS — J449 Chronic obstructive pulmonary disease, unspecified: Secondary | ICD-10-CM | POA: Diagnosis not present

## 2020-11-20 DIAGNOSIS — L89152 Pressure ulcer of sacral region, stage 2: Secondary | ICD-10-CM | POA: Diagnosis not present

## 2020-11-20 DIAGNOSIS — J9601 Acute respiratory failure with hypoxia: Secondary | ICD-10-CM | POA: Diagnosis not present

## 2020-11-20 DIAGNOSIS — I5033 Acute on chronic diastolic (congestive) heart failure: Secondary | ICD-10-CM | POA: Diagnosis not present

## 2020-11-20 DIAGNOSIS — K219 Gastro-esophageal reflux disease without esophagitis: Secondary | ICD-10-CM | POA: Diagnosis not present

## 2020-11-20 DIAGNOSIS — I110 Hypertensive heart disease with heart failure: Secondary | ICD-10-CM | POA: Diagnosis not present

## 2020-11-20 DIAGNOSIS — I2510 Atherosclerotic heart disease of native coronary artery without angina pectoris: Secondary | ICD-10-CM | POA: Diagnosis not present

## 2020-11-20 DIAGNOSIS — D649 Anemia, unspecified: Secondary | ICD-10-CM | POA: Diagnosis not present

## 2020-11-21 DIAGNOSIS — I2510 Atherosclerotic heart disease of native coronary artery without angina pectoris: Secondary | ICD-10-CM | POA: Diagnosis not present

## 2020-11-21 DIAGNOSIS — J9601 Acute respiratory failure with hypoxia: Secondary | ICD-10-CM | POA: Diagnosis not present

## 2020-11-21 DIAGNOSIS — K219 Gastro-esophageal reflux disease without esophagitis: Secondary | ICD-10-CM | POA: Diagnosis not present

## 2020-11-21 DIAGNOSIS — D649 Anemia, unspecified: Secondary | ICD-10-CM | POA: Diagnosis not present

## 2020-11-21 DIAGNOSIS — L89152 Pressure ulcer of sacral region, stage 2: Secondary | ICD-10-CM | POA: Diagnosis not present

## 2020-11-21 DIAGNOSIS — I5033 Acute on chronic diastolic (congestive) heart failure: Secondary | ICD-10-CM | POA: Diagnosis not present

## 2020-11-21 DIAGNOSIS — I110 Hypertensive heart disease with heart failure: Secondary | ICD-10-CM | POA: Diagnosis not present

## 2020-11-21 DIAGNOSIS — I482 Chronic atrial fibrillation, unspecified: Secondary | ICD-10-CM | POA: Diagnosis not present

## 2020-11-21 DIAGNOSIS — J449 Chronic obstructive pulmonary disease, unspecified: Secondary | ICD-10-CM | POA: Diagnosis not present

## 2020-11-22 DIAGNOSIS — I2510 Atherosclerotic heart disease of native coronary artery without angina pectoris: Secondary | ICD-10-CM | POA: Diagnosis not present

## 2020-11-22 DIAGNOSIS — J449 Chronic obstructive pulmonary disease, unspecified: Secondary | ICD-10-CM | POA: Diagnosis not present

## 2020-11-22 DIAGNOSIS — D649 Anemia, unspecified: Secondary | ICD-10-CM | POA: Diagnosis not present

## 2020-11-22 DIAGNOSIS — I110 Hypertensive heart disease with heart failure: Secondary | ICD-10-CM | POA: Diagnosis not present

## 2020-11-22 DIAGNOSIS — J811 Chronic pulmonary edema: Secondary | ICD-10-CM | POA: Diagnosis not present

## 2020-11-22 DIAGNOSIS — I5033 Acute on chronic diastolic (congestive) heart failure: Secondary | ICD-10-CM | POA: Diagnosis not present

## 2020-11-22 DIAGNOSIS — N390 Urinary tract infection, site not specified: Secondary | ICD-10-CM | POA: Diagnosis not present

## 2020-11-22 DIAGNOSIS — J9601 Acute respiratory failure with hypoxia: Secondary | ICD-10-CM | POA: Diagnosis not present

## 2020-11-22 DIAGNOSIS — I482 Chronic atrial fibrillation, unspecified: Secondary | ICD-10-CM | POA: Diagnosis not present

## 2020-11-22 DIAGNOSIS — L89152 Pressure ulcer of sacral region, stage 2: Secondary | ICD-10-CM | POA: Diagnosis not present

## 2020-11-22 DIAGNOSIS — K219 Gastro-esophageal reflux disease without esophagitis: Secondary | ICD-10-CM | POA: Diagnosis not present

## 2020-11-23 DIAGNOSIS — I110 Hypertensive heart disease with heart failure: Secondary | ICD-10-CM | POA: Diagnosis not present

## 2020-11-23 DIAGNOSIS — L89152 Pressure ulcer of sacral region, stage 2: Secondary | ICD-10-CM | POA: Diagnosis not present

## 2020-11-23 DIAGNOSIS — J9601 Acute respiratory failure with hypoxia: Secondary | ICD-10-CM | POA: Diagnosis not present

## 2020-11-23 DIAGNOSIS — I5033 Acute on chronic diastolic (congestive) heart failure: Secondary | ICD-10-CM | POA: Diagnosis not present

## 2020-11-23 DIAGNOSIS — D649 Anemia, unspecified: Secondary | ICD-10-CM | POA: Diagnosis not present

## 2020-11-23 DIAGNOSIS — K219 Gastro-esophageal reflux disease without esophagitis: Secondary | ICD-10-CM | POA: Diagnosis not present

## 2020-11-23 DIAGNOSIS — I2510 Atherosclerotic heart disease of native coronary artery without angina pectoris: Secondary | ICD-10-CM | POA: Diagnosis not present

## 2020-11-23 DIAGNOSIS — I482 Chronic atrial fibrillation, unspecified: Secondary | ICD-10-CM | POA: Diagnosis not present

## 2020-11-23 DIAGNOSIS — J449 Chronic obstructive pulmonary disease, unspecified: Secondary | ICD-10-CM | POA: Diagnosis not present

## 2020-11-26 DIAGNOSIS — I2510 Atherosclerotic heart disease of native coronary artery without angina pectoris: Secondary | ICD-10-CM | POA: Diagnosis not present

## 2020-11-26 DIAGNOSIS — D649 Anemia, unspecified: Secondary | ICD-10-CM | POA: Diagnosis not present

## 2020-11-26 DIAGNOSIS — K219 Gastro-esophageal reflux disease without esophagitis: Secondary | ICD-10-CM | POA: Diagnosis not present

## 2020-11-26 DIAGNOSIS — J9601 Acute respiratory failure with hypoxia: Secondary | ICD-10-CM | POA: Diagnosis not present

## 2020-11-26 DIAGNOSIS — L89152 Pressure ulcer of sacral region, stage 2: Secondary | ICD-10-CM | POA: Diagnosis not present

## 2020-11-26 DIAGNOSIS — I5033 Acute on chronic diastolic (congestive) heart failure: Secondary | ICD-10-CM | POA: Diagnosis not present

## 2020-11-26 DIAGNOSIS — I110 Hypertensive heart disease with heart failure: Secondary | ICD-10-CM | POA: Diagnosis not present

## 2020-11-26 DIAGNOSIS — J449 Chronic obstructive pulmonary disease, unspecified: Secondary | ICD-10-CM | POA: Diagnosis not present

## 2020-11-26 DIAGNOSIS — I482 Chronic atrial fibrillation, unspecified: Secondary | ICD-10-CM | POA: Diagnosis not present

## 2020-11-27 DIAGNOSIS — I2510 Atherosclerotic heart disease of native coronary artery without angina pectoris: Secondary | ICD-10-CM | POA: Diagnosis not present

## 2020-11-27 DIAGNOSIS — D649 Anemia, unspecified: Secondary | ICD-10-CM | POA: Diagnosis not present

## 2020-11-27 DIAGNOSIS — K219 Gastro-esophageal reflux disease without esophagitis: Secondary | ICD-10-CM | POA: Diagnosis not present

## 2020-11-27 DIAGNOSIS — I5033 Acute on chronic diastolic (congestive) heart failure: Secondary | ICD-10-CM | POA: Diagnosis not present

## 2020-11-27 DIAGNOSIS — I482 Chronic atrial fibrillation, unspecified: Secondary | ICD-10-CM | POA: Diagnosis not present

## 2020-11-27 DIAGNOSIS — J449 Chronic obstructive pulmonary disease, unspecified: Secondary | ICD-10-CM | POA: Diagnosis not present

## 2020-11-27 DIAGNOSIS — L89152 Pressure ulcer of sacral region, stage 2: Secondary | ICD-10-CM | POA: Diagnosis not present

## 2020-11-27 DIAGNOSIS — J9601 Acute respiratory failure with hypoxia: Secondary | ICD-10-CM | POA: Diagnosis not present

## 2020-11-27 DIAGNOSIS — I110 Hypertensive heart disease with heart failure: Secondary | ICD-10-CM | POA: Diagnosis not present

## 2020-11-29 DIAGNOSIS — K219 Gastro-esophageal reflux disease without esophagitis: Secondary | ICD-10-CM | POA: Diagnosis not present

## 2020-11-29 DIAGNOSIS — I482 Chronic atrial fibrillation, unspecified: Secondary | ICD-10-CM | POA: Diagnosis not present

## 2020-11-29 DIAGNOSIS — L89152 Pressure ulcer of sacral region, stage 2: Secondary | ICD-10-CM | POA: Diagnosis not present

## 2020-11-29 DIAGNOSIS — I110 Hypertensive heart disease with heart failure: Secondary | ICD-10-CM | POA: Diagnosis not present

## 2020-11-29 DIAGNOSIS — I2510 Atherosclerotic heart disease of native coronary artery without angina pectoris: Secondary | ICD-10-CM | POA: Diagnosis not present

## 2020-11-29 DIAGNOSIS — J449 Chronic obstructive pulmonary disease, unspecified: Secondary | ICD-10-CM | POA: Diagnosis not present

## 2020-11-29 DIAGNOSIS — D649 Anemia, unspecified: Secondary | ICD-10-CM | POA: Diagnosis not present

## 2020-11-29 DIAGNOSIS — J9601 Acute respiratory failure with hypoxia: Secondary | ICD-10-CM | POA: Diagnosis not present

## 2020-11-29 DIAGNOSIS — I5033 Acute on chronic diastolic (congestive) heart failure: Secondary | ICD-10-CM | POA: Diagnosis not present

## 2020-11-30 DIAGNOSIS — K219 Gastro-esophageal reflux disease without esophagitis: Secondary | ICD-10-CM | POA: Diagnosis not present

## 2020-11-30 DIAGNOSIS — I2510 Atherosclerotic heart disease of native coronary artery without angina pectoris: Secondary | ICD-10-CM | POA: Diagnosis not present

## 2020-11-30 DIAGNOSIS — D649 Anemia, unspecified: Secondary | ICD-10-CM | POA: Diagnosis not present

## 2020-11-30 DIAGNOSIS — J449 Chronic obstructive pulmonary disease, unspecified: Secondary | ICD-10-CM | POA: Diagnosis not present

## 2020-11-30 DIAGNOSIS — I110 Hypertensive heart disease with heart failure: Secondary | ICD-10-CM | POA: Diagnosis not present

## 2020-11-30 DIAGNOSIS — I482 Chronic atrial fibrillation, unspecified: Secondary | ICD-10-CM | POA: Diagnosis not present

## 2020-11-30 DIAGNOSIS — I5033 Acute on chronic diastolic (congestive) heart failure: Secondary | ICD-10-CM | POA: Diagnosis not present

## 2020-11-30 DIAGNOSIS — L89152 Pressure ulcer of sacral region, stage 2: Secondary | ICD-10-CM | POA: Diagnosis not present

## 2020-11-30 DIAGNOSIS — J9601 Acute respiratory failure with hypoxia: Secondary | ICD-10-CM | POA: Diagnosis not present

## 2020-12-03 DIAGNOSIS — L89152 Pressure ulcer of sacral region, stage 2: Secondary | ICD-10-CM | POA: Diagnosis not present

## 2020-12-03 DIAGNOSIS — J449 Chronic obstructive pulmonary disease, unspecified: Secondary | ICD-10-CM | POA: Diagnosis not present

## 2020-12-03 DIAGNOSIS — I110 Hypertensive heart disease with heart failure: Secondary | ICD-10-CM | POA: Diagnosis not present

## 2020-12-03 DIAGNOSIS — K219 Gastro-esophageal reflux disease without esophagitis: Secondary | ICD-10-CM | POA: Diagnosis not present

## 2020-12-03 DIAGNOSIS — J9601 Acute respiratory failure with hypoxia: Secondary | ICD-10-CM | POA: Diagnosis not present

## 2020-12-03 DIAGNOSIS — D649 Anemia, unspecified: Secondary | ICD-10-CM | POA: Diagnosis not present

## 2020-12-03 DIAGNOSIS — I5033 Acute on chronic diastolic (congestive) heart failure: Secondary | ICD-10-CM | POA: Diagnosis not present

## 2020-12-03 DIAGNOSIS — I482 Chronic atrial fibrillation, unspecified: Secondary | ICD-10-CM | POA: Diagnosis not present

## 2020-12-03 DIAGNOSIS — I2510 Atherosclerotic heart disease of native coronary artery without angina pectoris: Secondary | ICD-10-CM | POA: Diagnosis not present

## 2020-12-04 DIAGNOSIS — D649 Anemia, unspecified: Secondary | ICD-10-CM | POA: Diagnosis not present

## 2020-12-04 DIAGNOSIS — L89152 Pressure ulcer of sacral region, stage 2: Secondary | ICD-10-CM | POA: Diagnosis not present

## 2020-12-04 DIAGNOSIS — J9601 Acute respiratory failure with hypoxia: Secondary | ICD-10-CM | POA: Diagnosis not present

## 2020-12-04 DIAGNOSIS — J449 Chronic obstructive pulmonary disease, unspecified: Secondary | ICD-10-CM | POA: Diagnosis not present

## 2020-12-04 DIAGNOSIS — I110 Hypertensive heart disease with heart failure: Secondary | ICD-10-CM | POA: Diagnosis not present

## 2020-12-04 DIAGNOSIS — I5033 Acute on chronic diastolic (congestive) heart failure: Secondary | ICD-10-CM | POA: Diagnosis not present

## 2020-12-04 DIAGNOSIS — I482 Chronic atrial fibrillation, unspecified: Secondary | ICD-10-CM | POA: Diagnosis not present

## 2020-12-04 DIAGNOSIS — I2510 Atherosclerotic heart disease of native coronary artery without angina pectoris: Secondary | ICD-10-CM | POA: Diagnosis not present

## 2020-12-04 DIAGNOSIS — K219 Gastro-esophageal reflux disease without esophagitis: Secondary | ICD-10-CM | POA: Diagnosis not present

## 2020-12-07 DIAGNOSIS — J449 Chronic obstructive pulmonary disease, unspecified: Secondary | ICD-10-CM | POA: Diagnosis not present

## 2020-12-07 DIAGNOSIS — I2510 Atherosclerotic heart disease of native coronary artery without angina pectoris: Secondary | ICD-10-CM | POA: Diagnosis not present

## 2020-12-07 DIAGNOSIS — I110 Hypertensive heart disease with heart failure: Secondary | ICD-10-CM | POA: Diagnosis not present

## 2020-12-07 DIAGNOSIS — J9601 Acute respiratory failure with hypoxia: Secondary | ICD-10-CM | POA: Diagnosis not present

## 2020-12-07 DIAGNOSIS — I482 Chronic atrial fibrillation, unspecified: Secondary | ICD-10-CM | POA: Diagnosis not present

## 2020-12-07 DIAGNOSIS — L89152 Pressure ulcer of sacral region, stage 2: Secondary | ICD-10-CM | POA: Diagnosis not present

## 2020-12-07 DIAGNOSIS — K219 Gastro-esophageal reflux disease without esophagitis: Secondary | ICD-10-CM | POA: Diagnosis not present

## 2020-12-07 DIAGNOSIS — I5033 Acute on chronic diastolic (congestive) heart failure: Secondary | ICD-10-CM | POA: Diagnosis not present

## 2020-12-07 DIAGNOSIS — D649 Anemia, unspecified: Secondary | ICD-10-CM | POA: Diagnosis not present

## 2020-12-10 DIAGNOSIS — J9601 Acute respiratory failure with hypoxia: Secondary | ICD-10-CM | POA: Diagnosis not present

## 2020-12-10 DIAGNOSIS — I2510 Atherosclerotic heart disease of native coronary artery without angina pectoris: Secondary | ICD-10-CM | POA: Diagnosis not present

## 2020-12-10 DIAGNOSIS — I482 Chronic atrial fibrillation, unspecified: Secondary | ICD-10-CM | POA: Diagnosis not present

## 2020-12-10 DIAGNOSIS — J449 Chronic obstructive pulmonary disease, unspecified: Secondary | ICD-10-CM | POA: Diagnosis not present

## 2020-12-10 DIAGNOSIS — K219 Gastro-esophageal reflux disease without esophagitis: Secondary | ICD-10-CM | POA: Diagnosis not present

## 2020-12-10 DIAGNOSIS — D649 Anemia, unspecified: Secondary | ICD-10-CM | POA: Diagnosis not present

## 2020-12-10 DIAGNOSIS — I5033 Acute on chronic diastolic (congestive) heart failure: Secondary | ICD-10-CM | POA: Diagnosis not present

## 2020-12-10 DIAGNOSIS — I110 Hypertensive heart disease with heart failure: Secondary | ICD-10-CM | POA: Diagnosis not present

## 2020-12-10 DIAGNOSIS — L89152 Pressure ulcer of sacral region, stage 2: Secondary | ICD-10-CM | POA: Diagnosis not present

## 2020-12-11 DIAGNOSIS — K219 Gastro-esophageal reflux disease without esophagitis: Secondary | ICD-10-CM | POA: Diagnosis not present

## 2020-12-11 DIAGNOSIS — I5033 Acute on chronic diastolic (congestive) heart failure: Secondary | ICD-10-CM | POA: Diagnosis not present

## 2020-12-11 DIAGNOSIS — J9601 Acute respiratory failure with hypoxia: Secondary | ICD-10-CM | POA: Diagnosis not present

## 2020-12-11 DIAGNOSIS — I482 Chronic atrial fibrillation, unspecified: Secondary | ICD-10-CM | POA: Diagnosis not present

## 2020-12-11 DIAGNOSIS — L89152 Pressure ulcer of sacral region, stage 2: Secondary | ICD-10-CM | POA: Diagnosis not present

## 2020-12-11 DIAGNOSIS — J449 Chronic obstructive pulmonary disease, unspecified: Secondary | ICD-10-CM | POA: Diagnosis not present

## 2020-12-11 DIAGNOSIS — D649 Anemia, unspecified: Secondary | ICD-10-CM | POA: Diagnosis not present

## 2020-12-11 DIAGNOSIS — I2510 Atherosclerotic heart disease of native coronary artery without angina pectoris: Secondary | ICD-10-CM | POA: Diagnosis not present

## 2020-12-11 DIAGNOSIS — I110 Hypertensive heart disease with heart failure: Secondary | ICD-10-CM | POA: Diagnosis not present

## 2020-12-14 DIAGNOSIS — I110 Hypertensive heart disease with heart failure: Secondary | ICD-10-CM | POA: Diagnosis not present

## 2020-12-14 DIAGNOSIS — I5033 Acute on chronic diastolic (congestive) heart failure: Secondary | ICD-10-CM | POA: Diagnosis not present

## 2020-12-14 DIAGNOSIS — L89152 Pressure ulcer of sacral region, stage 2: Secondary | ICD-10-CM | POA: Diagnosis not present

## 2020-12-14 DIAGNOSIS — K219 Gastro-esophageal reflux disease without esophagitis: Secondary | ICD-10-CM | POA: Diagnosis not present

## 2020-12-14 DIAGNOSIS — I482 Chronic atrial fibrillation, unspecified: Secondary | ICD-10-CM | POA: Diagnosis not present

## 2020-12-14 DIAGNOSIS — J9601 Acute respiratory failure with hypoxia: Secondary | ICD-10-CM | POA: Diagnosis not present

## 2020-12-14 DIAGNOSIS — I2510 Atherosclerotic heart disease of native coronary artery without angina pectoris: Secondary | ICD-10-CM | POA: Diagnosis not present

## 2020-12-14 DIAGNOSIS — D649 Anemia, unspecified: Secondary | ICD-10-CM | POA: Diagnosis not present

## 2020-12-14 DIAGNOSIS — J449 Chronic obstructive pulmonary disease, unspecified: Secondary | ICD-10-CM | POA: Diagnosis not present

## 2020-12-16 DIAGNOSIS — R0989 Other specified symptoms and signs involving the circulatory and respiratory systems: Secondary | ICD-10-CM | POA: Diagnosis not present

## 2020-12-17 DIAGNOSIS — I110 Hypertensive heart disease with heart failure: Secondary | ICD-10-CM | POA: Diagnosis not present

## 2020-12-17 DIAGNOSIS — I5033 Acute on chronic diastolic (congestive) heart failure: Secondary | ICD-10-CM | POA: Diagnosis not present

## 2020-12-17 DIAGNOSIS — J9601 Acute respiratory failure with hypoxia: Secondary | ICD-10-CM | POA: Diagnosis not present

## 2020-12-17 DIAGNOSIS — I2510 Atherosclerotic heart disease of native coronary artery without angina pectoris: Secondary | ICD-10-CM | POA: Diagnosis not present

## 2020-12-17 DIAGNOSIS — D649 Anemia, unspecified: Secondary | ICD-10-CM | POA: Diagnosis not present

## 2020-12-17 DIAGNOSIS — K219 Gastro-esophageal reflux disease without esophagitis: Secondary | ICD-10-CM | POA: Diagnosis not present

## 2020-12-17 DIAGNOSIS — L89152 Pressure ulcer of sacral region, stage 2: Secondary | ICD-10-CM | POA: Diagnosis not present

## 2020-12-17 DIAGNOSIS — J449 Chronic obstructive pulmonary disease, unspecified: Secondary | ICD-10-CM | POA: Diagnosis not present

## 2020-12-17 DIAGNOSIS — I482 Chronic atrial fibrillation, unspecified: Secondary | ICD-10-CM | POA: Diagnosis not present

## 2020-12-18 DIAGNOSIS — J449 Chronic obstructive pulmonary disease, unspecified: Secondary | ICD-10-CM | POA: Diagnosis not present

## 2020-12-18 DIAGNOSIS — J9601 Acute respiratory failure with hypoxia: Secondary | ICD-10-CM | POA: Diagnosis not present

## 2020-12-18 DIAGNOSIS — K219 Gastro-esophageal reflux disease without esophagitis: Secondary | ICD-10-CM | POA: Diagnosis not present

## 2020-12-18 DIAGNOSIS — I2510 Atherosclerotic heart disease of native coronary artery without angina pectoris: Secondary | ICD-10-CM | POA: Diagnosis not present

## 2020-12-18 DIAGNOSIS — I5033 Acute on chronic diastolic (congestive) heart failure: Secondary | ICD-10-CM | POA: Diagnosis not present

## 2020-12-18 DIAGNOSIS — I482 Chronic atrial fibrillation, unspecified: Secondary | ICD-10-CM | POA: Diagnosis not present

## 2020-12-18 DIAGNOSIS — I110 Hypertensive heart disease with heart failure: Secondary | ICD-10-CM | POA: Diagnosis not present

## 2020-12-18 DIAGNOSIS — L89152 Pressure ulcer of sacral region, stage 2: Secondary | ICD-10-CM | POA: Diagnosis not present

## 2020-12-18 DIAGNOSIS — D649 Anemia, unspecified: Secondary | ICD-10-CM | POA: Diagnosis not present

## 2020-12-19 DIAGNOSIS — M25561 Pain in right knee: Secondary | ICD-10-CM | POA: Diagnosis not present

## 2020-12-19 DIAGNOSIS — S199XXA Unspecified injury of neck, initial encounter: Secondary | ICD-10-CM | POA: Diagnosis not present

## 2020-12-19 DIAGNOSIS — I700 Atherosclerosis of aorta: Secondary | ICD-10-CM | POA: Diagnosis not present

## 2020-12-19 DIAGNOSIS — W19XXXA Unspecified fall, initial encounter: Secondary | ICD-10-CM | POA: Diagnosis not present

## 2020-12-19 DIAGNOSIS — R0902 Hypoxemia: Secondary | ICD-10-CM | POA: Diagnosis not present

## 2020-12-19 DIAGNOSIS — S61512A Laceration without foreign body of left wrist, initial encounter: Secondary | ICD-10-CM | POA: Diagnosis not present

## 2020-12-19 DIAGNOSIS — S0101XA Laceration without foreign body of scalp, initial encounter: Secondary | ICD-10-CM | POA: Diagnosis not present

## 2020-12-19 DIAGNOSIS — S0990XA Unspecified injury of head, initial encounter: Secondary | ICD-10-CM | POA: Diagnosis not present

## 2020-12-19 DIAGNOSIS — I672 Cerebral atherosclerosis: Secondary | ICD-10-CM | POA: Diagnosis not present

## 2020-12-20 DIAGNOSIS — I110 Hypertensive heart disease with heart failure: Secondary | ICD-10-CM | POA: Diagnosis not present

## 2020-12-20 DIAGNOSIS — J449 Chronic obstructive pulmonary disease, unspecified: Secondary | ICD-10-CM | POA: Diagnosis not present

## 2020-12-20 DIAGNOSIS — I482 Chronic atrial fibrillation, unspecified: Secondary | ICD-10-CM | POA: Diagnosis not present

## 2020-12-20 DIAGNOSIS — K219 Gastro-esophageal reflux disease without esophagitis: Secondary | ICD-10-CM | POA: Diagnosis not present

## 2020-12-20 DIAGNOSIS — I5033 Acute on chronic diastolic (congestive) heart failure: Secondary | ICD-10-CM | POA: Diagnosis not present

## 2020-12-20 DIAGNOSIS — D649 Anemia, unspecified: Secondary | ICD-10-CM | POA: Diagnosis not present

## 2020-12-20 DIAGNOSIS — I2510 Atherosclerotic heart disease of native coronary artery without angina pectoris: Secondary | ICD-10-CM | POA: Diagnosis not present

## 2020-12-20 DIAGNOSIS — J9601 Acute respiratory failure with hypoxia: Secondary | ICD-10-CM | POA: Diagnosis not present

## 2020-12-20 DIAGNOSIS — L89152 Pressure ulcer of sacral region, stage 2: Secondary | ICD-10-CM | POA: Diagnosis not present

## 2020-12-21 DIAGNOSIS — I2510 Atherosclerotic heart disease of native coronary artery without angina pectoris: Secondary | ICD-10-CM | POA: Diagnosis not present

## 2020-12-21 DIAGNOSIS — L89152 Pressure ulcer of sacral region, stage 2: Secondary | ICD-10-CM | POA: Diagnosis not present

## 2020-12-21 DIAGNOSIS — J9601 Acute respiratory failure with hypoxia: Secondary | ICD-10-CM | POA: Diagnosis not present

## 2020-12-21 DIAGNOSIS — R0989 Other specified symptoms and signs involving the circulatory and respiratory systems: Secondary | ICD-10-CM | POA: Diagnosis not present

## 2020-12-21 DIAGNOSIS — D649 Anemia, unspecified: Secondary | ICD-10-CM | POA: Diagnosis not present

## 2020-12-21 DIAGNOSIS — I5033 Acute on chronic diastolic (congestive) heart failure: Secondary | ICD-10-CM | POA: Diagnosis not present

## 2020-12-21 DIAGNOSIS — J449 Chronic obstructive pulmonary disease, unspecified: Secondary | ICD-10-CM | POA: Diagnosis not present

## 2020-12-21 DIAGNOSIS — K219 Gastro-esophageal reflux disease without esophagitis: Secondary | ICD-10-CM | POA: Diagnosis not present

## 2020-12-21 DIAGNOSIS — I482 Chronic atrial fibrillation, unspecified: Secondary | ICD-10-CM | POA: Diagnosis not present

## 2020-12-21 DIAGNOSIS — I110 Hypertensive heart disease with heart failure: Secondary | ICD-10-CM | POA: Diagnosis not present

## 2020-12-24 DIAGNOSIS — K219 Gastro-esophageal reflux disease without esophagitis: Secondary | ICD-10-CM | POA: Diagnosis not present

## 2020-12-24 DIAGNOSIS — I2510 Atherosclerotic heart disease of native coronary artery without angina pectoris: Secondary | ICD-10-CM | POA: Diagnosis not present

## 2020-12-24 DIAGNOSIS — I482 Chronic atrial fibrillation, unspecified: Secondary | ICD-10-CM | POA: Diagnosis not present

## 2020-12-24 DIAGNOSIS — J9601 Acute respiratory failure with hypoxia: Secondary | ICD-10-CM | POA: Diagnosis not present

## 2020-12-24 DIAGNOSIS — L89152 Pressure ulcer of sacral region, stage 2: Secondary | ICD-10-CM | POA: Diagnosis not present

## 2020-12-24 DIAGNOSIS — J449 Chronic obstructive pulmonary disease, unspecified: Secondary | ICD-10-CM | POA: Diagnosis not present

## 2020-12-24 DIAGNOSIS — D649 Anemia, unspecified: Secondary | ICD-10-CM | POA: Diagnosis not present

## 2020-12-24 DIAGNOSIS — I5033 Acute on chronic diastolic (congestive) heart failure: Secondary | ICD-10-CM | POA: Diagnosis not present

## 2020-12-24 DIAGNOSIS — I110 Hypertensive heart disease with heart failure: Secondary | ICD-10-CM | POA: Diagnosis not present

## 2020-12-27 DIAGNOSIS — I110 Hypertensive heart disease with heart failure: Secondary | ICD-10-CM | POA: Diagnosis not present

## 2020-12-27 DIAGNOSIS — K219 Gastro-esophageal reflux disease without esophagitis: Secondary | ICD-10-CM | POA: Diagnosis not present

## 2020-12-27 DIAGNOSIS — I482 Chronic atrial fibrillation, unspecified: Secondary | ICD-10-CM | POA: Diagnosis not present

## 2020-12-27 DIAGNOSIS — I5033 Acute on chronic diastolic (congestive) heart failure: Secondary | ICD-10-CM | POA: Diagnosis not present

## 2020-12-27 DIAGNOSIS — D649 Anemia, unspecified: Secondary | ICD-10-CM | POA: Diagnosis not present

## 2020-12-27 DIAGNOSIS — L89152 Pressure ulcer of sacral region, stage 2: Secondary | ICD-10-CM | POA: Diagnosis not present

## 2020-12-27 DIAGNOSIS — I2510 Atherosclerotic heart disease of native coronary artery without angina pectoris: Secondary | ICD-10-CM | POA: Diagnosis not present

## 2020-12-27 DIAGNOSIS — J449 Chronic obstructive pulmonary disease, unspecified: Secondary | ICD-10-CM | POA: Diagnosis not present

## 2020-12-27 DIAGNOSIS — J9601 Acute respiratory failure with hypoxia: Secondary | ICD-10-CM | POA: Diagnosis not present

## 2021-01-13 DEATH — deceased
